# Patient Record
Sex: Male | Born: 1965 | Race: Black or African American | Hispanic: No | Marital: Single | Smoking: Former smoker
Health system: Southern US, Community
[De-identification: ages and names within clinical notes are randomized; demographics above are authoritative.]

## PROBLEM LIST (undated history)

## (undated) DIAGNOSIS — R066 Hiccough: Secondary | ICD-10-CM

## (undated) DIAGNOSIS — G8929 Other chronic pain: Secondary | ICD-10-CM

## (undated) DIAGNOSIS — M545 Low back pain, unspecified: Secondary | ICD-10-CM

## (undated) DIAGNOSIS — G35 Multiple sclerosis: Secondary | ICD-10-CM

## (undated) DIAGNOSIS — G893 Neoplasm related pain (acute) (chronic): Secondary | ICD-10-CM

## (undated) DIAGNOSIS — I10 Essential (primary) hypertension: Secondary | ICD-10-CM

## (undated) DIAGNOSIS — M25561 Pain in right knee: Secondary | ICD-10-CM

## (undated) DIAGNOSIS — C3490 Malignant neoplasm of unspecified part of unspecified bronchus or lung: Secondary | ICD-10-CM

## (undated) HISTORY — DX: Pain in right knee: M25.561

## (undated) HISTORY — DX: Malignant neoplasm of unspecified part of unspecified bronchus or lung: C34.90

## (undated) HISTORY — DX: Hiccough: R06.6

## (undated) HISTORY — DX: Neoplasm related pain (acute) (chronic): G89.3

## (undated) HISTORY — DX: Low back pain: M54.5

## (undated) HISTORY — DX: Other chronic pain: G89.29

## (undated) HISTORY — DX: Low back pain, unspecified: M54.50

## (undated) HISTORY — PX: CARDIAC SURGERY: SHX584

## (undated) HISTORY — DX: Multiple sclerosis: G35

## (undated) HISTORY — DX: Essential (primary) hypertension: I10

---

## 2006-12-21 ENCOUNTER — Emergency Department: Payer: Self-pay | Admitting: Emergency Medicine

## 2007-04-09 ENCOUNTER — Emergency Department: Payer: Self-pay | Admitting: Internal Medicine

## 2007-07-13 ENCOUNTER — Ambulatory Visit: Payer: Self-pay | Admitting: Ophthalmology

## 2007-07-13 ENCOUNTER — Emergency Department: Payer: Self-pay | Admitting: Emergency Medicine

## 2007-08-07 ENCOUNTER — Emergency Department: Payer: Self-pay | Admitting: Emergency Medicine

## 2008-02-08 ENCOUNTER — Emergency Department: Payer: Self-pay | Admitting: Internal Medicine

## 2008-03-16 ENCOUNTER — Ambulatory Visit: Payer: Self-pay | Admitting: Neurology

## 2008-04-01 ENCOUNTER — Emergency Department: Payer: Self-pay | Admitting: Internal Medicine

## 2008-04-20 ENCOUNTER — Ambulatory Visit: Payer: Self-pay | Admitting: Pain Medicine

## 2008-06-18 ENCOUNTER — Emergency Department: Payer: Self-pay | Admitting: Emergency Medicine

## 2009-01-09 ENCOUNTER — Emergency Department: Payer: Self-pay | Admitting: Emergency Medicine

## 2009-10-03 ENCOUNTER — Ambulatory Visit: Payer: Self-pay | Admitting: Surgery

## 2009-10-10 ENCOUNTER — Inpatient Hospital Stay: Payer: Self-pay | Admitting: Surgery

## 2009-10-10 ENCOUNTER — Ambulatory Visit: Payer: Self-pay | Admitting: Surgery

## 2010-03-17 ENCOUNTER — Emergency Department: Payer: Self-pay | Admitting: Emergency Medicine

## 2010-05-29 ENCOUNTER — Ambulatory Visit: Payer: Self-pay | Admitting: Surgery

## 2010-05-31 ENCOUNTER — Ambulatory Visit: Payer: Self-pay | Admitting: Surgery

## 2010-05-31 ENCOUNTER — Emergency Department: Payer: Self-pay | Admitting: Emergency Medicine

## 2010-06-03 LAB — PATHOLOGY REPORT

## 2010-09-07 ENCOUNTER — Emergency Department: Payer: Self-pay | Admitting: Internal Medicine

## 2011-07-30 ENCOUNTER — Emergency Department: Payer: Self-pay | Admitting: Emergency Medicine

## 2011-09-02 ENCOUNTER — Ambulatory Visit: Payer: Self-pay | Admitting: Neurology

## 2011-09-02 LAB — CREATININE, SERUM
Creatinine: 1.1 mg/dL (ref 0.60–1.30)
EGFR (African American): >60
EGFR (Non-African Amer.): >60

## 2011-11-17 ENCOUNTER — Ambulatory Visit: Payer: Self-pay

## 2012-04-23 ENCOUNTER — Emergency Department: Payer: Self-pay | Admitting: Internal Medicine
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
## 2014-11-07 NOTE — Op Note (Signed)
PATIENT NAME:  Ryan Cannon, Ryan Cannon MR#:  [id number] DATE OF BIRTH:  [birth year]  DATE OF PROCEDURE:  04/23/2012  PREOPERATIVE DIAGNOSIS: Right scrotal wall abscess.   POSTOPERATIVE DIAGNOSIS: Right scrotal wall abscess.   PROCEDURE: Incision and drainage of scrotal wall abscess.   PLACE: Emergency Department.   SURGEON: Shanda Cadotte C. Bernardo Heater, MD   HISTORY OF PRESENT ILLNESS: Refer to the dictated consultation note. He has requested incision and drainage at bedside.   DESCRIPTION OF PROCEDURE: The patient was in the supine position on the ER bed. The groin site was prepped and draped in the usual fashion. He was given 2 mg of IV morphine by the Emergency Department  staff. The fluctuant area was anesthetized with approximately 3 mL of 1% plain Xylocaine. Using a #11 blade, an approximately 3 cm incision was made and a moderate amount of purulent drainage was obtained. A hemostat was inserted in the site, and no loculations were present. The site was irrigated with approximately 50 mL of saline. No significant bleeding was noted. A 1/4-inch iodoform dressing was placed along with 4 x 4's and a swimmer supporter.   PLAN: He will follow up in the office 10/07 for I and D site check. He was instructed to return to the Emergency Department for any increasing pain or development of fever. He was previously given pain medication and antibiotic prescription by the Emergency Department staff.  ____________________________ Ronda Fairly. Bernardo Heater, MD scs:cbb D: 04/23/2012 15:37:54 ET T: 04/23/2012 16:29:27 ET JOB#: [id number]  cc: Nicki Reaper C. Bernardo Heater, MD, <Dictator> Abbie Sons MD ELECTRONICALLY SIGNED 04/28/2012 15:26

## 2015-02-09 ENCOUNTER — Ambulatory Visit: Payer: Self-pay | Admitting: Family Medicine

## 2015-06-07 ENCOUNTER — Encounter: Payer: Self-pay | Admitting: Family Medicine

## 2015-06-07 ENCOUNTER — Ambulatory Visit (INDEPENDENT_AMBULATORY_CARE_PROVIDER_SITE_OTHER): Payer: Medicaid Other | Admitting: Family Medicine

## 2015-06-07 VITALS — BP 122/78 | HR 80 | Temp 98.6°F | Resp 16 | Ht 69.0 in | Wt 184.4 lb

## 2015-06-07 DIAGNOSIS — G35 Multiple sclerosis: Secondary | ICD-10-CM | POA: Insufficient documentation

## 2015-06-07 DIAGNOSIS — Z716 Tobacco abuse counseling: Secondary | ICD-10-CM | POA: Insufficient documentation

## 2015-06-07 DIAGNOSIS — R519 Headache, unspecified: Secondary | ICD-10-CM | POA: Insufficient documentation

## 2015-06-07 DIAGNOSIS — R51 Headache: Secondary | ICD-10-CM

## 2015-06-07 DIAGNOSIS — R569 Unspecified convulsions: Secondary | ICD-10-CM | POA: Insufficient documentation

## 2015-06-07 DIAGNOSIS — J449 Chronic obstructive pulmonary disease, unspecified: Secondary | ICD-10-CM | POA: Diagnosis not present

## 2015-06-07 DIAGNOSIS — R29898 Other symptoms and signs involving the musculoskeletal system: Secondary | ICD-10-CM

## 2015-06-07 DIAGNOSIS — G894 Chronic pain syndrome: Secondary | ICD-10-CM | POA: Diagnosis not present

## 2015-06-07 DIAGNOSIS — Z720 Tobacco use: Secondary | ICD-10-CM

## 2015-06-07 DIAGNOSIS — G8929 Other chronic pain: Secondary | ICD-10-CM | POA: Insufficient documentation

## 2015-06-07 MED ORDER — BUPROPION HCL ER (SR) 150 MG PO TB12: 150.0000 mg | ORAL_TABLET | Freq: Two times a day (BID) | ORAL | Status: DC

## 2015-06-07 MED ORDER — TRAMADOL HCL 50 MG PO TABS: 50.0000 mg | ORAL_TABLET | Freq: Two times a day (BID) | ORAL | Status: DC | PRN
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
## 2015-07-25 ENCOUNTER — Other Ambulatory Visit: Payer: Self-pay | Admitting: Family Medicine

## 2015-07-25 DIAGNOSIS — G35 Multiple sclerosis: Secondary | ICD-10-CM

## 2015-07-25 DIAGNOSIS — G894 Chronic pain syndrome: Secondary | ICD-10-CM

## 2015-07-25 NOTE — Telephone Encounter (Signed)
Patient was prescribed Tramadol for the pain in his leg. Stated that it really helped and is requesting a refill. If possible please send to [facility] [address]

## 2015-07-27 ENCOUNTER — Other Ambulatory Visit: Payer: Self-pay | Admitting: Neurology

## 2015-07-27 DIAGNOSIS — G8929 Other chronic pain: Secondary | ICD-10-CM

## 2015-07-27 DIAGNOSIS — R51 Headache: Secondary | ICD-10-CM

## 2015-07-27 DIAGNOSIS — G35 Multiple sclerosis: Secondary | ICD-10-CM

## 2015-07-27 MED ORDER — TRAMADOL HCL 50 MG PO TABS: 50.0000 mg | ORAL_TABLET | Freq: Two times a day (BID) | ORAL | Status: DC | PRN

## 2015-08-16 ENCOUNTER — Ambulatory Visit
Admission: RE | Admit: 2015-08-16 | Discharge: 2015-08-16 | Disposition: A | Discharge status: Home / Self Care | Payer: Medicaid Other | Source: Ambulatory Visit | Attending: Neurology | Admitting: Neurology

## 2015-08-16 DIAGNOSIS — G35 Multiple sclerosis: Secondary | ICD-10-CM

## 2015-08-16 DIAGNOSIS — R51 Headache: Secondary | ICD-10-CM | POA: Diagnosis present

## 2015-08-16 DIAGNOSIS — G3189 Other specified degenerative diseases of nervous system: Secondary | ICD-10-CM | POA: Insufficient documentation

## 2015-08-16 DIAGNOSIS — R519 Headache, unspecified: Secondary | ICD-10-CM

## 2015-08-16 DIAGNOSIS — G8929 Other chronic pain: Secondary | ICD-10-CM

## 2015-08-16 MED ORDER — GADOBENATE DIMEGLUMINE 529 MG/ML IV SOLN
20.0000 mL | Freq: Once | INTRAVENOUS | Status: AC | PRN
  Administered 2015-08-16: 17 mL via INTRAVENOUS

## 2015-09-03 ENCOUNTER — Encounter: Payer: Self-pay | Admitting: Family Medicine

## 2015-09-03 ENCOUNTER — Ambulatory Visit (INDEPENDENT_AMBULATORY_CARE_PROVIDER_SITE_OTHER): Payer: Medicaid Other | Admitting: Family Medicine

## 2015-09-03 VITALS — BP 126/80 | HR 98 | Temp 98.5°F | Resp 14 | Ht 69.0 in | Wt 185.0 lb

## 2015-09-03 DIAGNOSIS — L0293 Carbuncle, unspecified: Secondary | ICD-10-CM | POA: Diagnosis not present

## 2015-09-03 DIAGNOSIS — G35 Multiple sclerosis: Secondary | ICD-10-CM

## 2015-09-03 DIAGNOSIS — G894 Chronic pain syndrome: Secondary | ICD-10-CM

## 2015-09-03 MED ORDER — SULFAMETHOXAZOLE-TRIMETHOPRIM 800-160 MG PO TABS: 1.0000 | ORAL_TABLET | Freq: Two times a day (BID) | ORAL | Status: DC

## 2015-09-03 MED ORDER — OXYCODONE-ACETAMINOPHEN 5-325 MG PO TABS: 1.0000 | ORAL_TABLET | Freq: Three times a day (TID) | ORAL | Status: DC | PRN
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
## 2015-09-20 ENCOUNTER — Other Ambulatory Visit: Payer: Self-pay | Admitting: Family Medicine

## 2015-09-20 DIAGNOSIS — L0293 Carbuncle, unspecified: Secondary | ICD-10-CM

## 2015-09-20 NOTE — Telephone Encounter (Signed)
Patient is requesting a refill on the following medications: oxyCodone-acetaminophen Bactrim

## 2015-09-24 ENCOUNTER — Other Ambulatory Visit: Payer: Self-pay

## 2015-09-24 DIAGNOSIS — L0293 Carbuncle, unspecified: Secondary | ICD-10-CM

## 2015-10-23 ENCOUNTER — Encounter: Payer: Self-pay | Admitting: *Deleted

## 2015-10-23 ENCOUNTER — Encounter: Payer: Self-pay | Admitting: Family Medicine

## 2015-10-23 ENCOUNTER — Ambulatory Visit (INDEPENDENT_AMBULATORY_CARE_PROVIDER_SITE_OTHER): Payer: Medicaid Other | Admitting: Family Medicine

## 2015-10-23 VITALS — BP 136/72 | HR 69 | Temp 97.7°F | Resp 14 | Wt 187.0 lb

## 2015-10-23 DIAGNOSIS — L0293 Carbuncle, unspecified: Secondary | ICD-10-CM | POA: Diagnosis not present

## 2015-10-23 DIAGNOSIS — B009 Herpesviral infection, unspecified: Secondary | ICD-10-CM | POA: Insufficient documentation

## 2015-10-23 DIAGNOSIS — L732 Hidradenitis suppurativa: Secondary | ICD-10-CM | POA: Insufficient documentation

## 2015-10-23 DIAGNOSIS — B001 Herpesviral vesicular dermatitis: Secondary | ICD-10-CM

## 2015-10-23 MED ORDER — OXYCODONE-ACETAMINOPHEN 5-325 MG PO TABS: 1.0000 | ORAL_TABLET | Freq: Four times a day (QID) | ORAL | Status: DC | PRN

## 2015-10-23 MED ORDER — VALACYCLOVIR HCL 1 G PO TABS: ORAL_TABLET | ORAL | Status: DC

## 2015-10-23 MED ORDER — CLINDAMYCIN HCL 300 MG PO CAPS: 300.0000 mg | ORAL_CAPSULE | Freq: Three times a day (TID) | ORAL | Status: DC

## 2015-10-23 NOTE — Assessment & Plan Note (Signed)
Symptoms consistent with HSV; valacyclovir PRN outbreaks; I explained the lesions in the groin area do not appear to be HSV

## 2015-10-23 NOTE — Assessment & Plan Note (Signed)
Explained diagnosis; refer to general surgeon; start antibiotics
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
## 2015-10-23 NOTE — Patient Instructions (Addendum)
Start the new antibiotic Please do eat yogurt daily or take a probiotic daily for the next month or two We want to replace the healthy germs in the gut If you notice foul, watery diarrhea in the next two months, schedule an appointment RIGHT AWAY We'll refer you to a general surgeon about your condition (hidradenitis suppurativa) If you have not heard anything from my staff in a week about any orders/referrals/studies from today, please contact us here to follow-up [phone] Never drink alcohol or take any other pain pills or controlled substances while taking the pain medicine I prescribed today Your goal blood pressure is less than 140 mmHg on top. Try to follow the DASH guidelines (DASH stands for Dietary Approaches to Stop Hypertension) Try to limit the sodium in your diet.  Ideally, consume less than 1.5 grams (less than 1,'500mg'$ ) per day. Do not add salt when cooking or at the table.  Check the sodium amount on labels when shopping, and choose items lower in sodium when given a choice. Avoid or limit foods that already contain a lot of sodium. Eat a diet rich in fruits and vegetables and whole grains. Use the antiviral medicine for the fever blister outbreaksHidradenitis Suppurativa Hidradenitis suppurativa is a long-term (chronic) skin disease that starts with blocked sweat glands or hair follicles. Bacteria may grow in these blocked openings of your skin. Hidradenitis suppurativa is like a severe form of acne that develops in areas of your body where acne would be unusual. It is most likely to affect the areas of your body where skin rubs against skin and becomes moist. This includes your:  Underarms.  Groin.  Genital areas.  Buttocks.  Upper thighs.  Breasts. Hidradenitis suppurativa may start out with small pimples. The pimples can develop into deep sores that break open (rupture) and drain pus. Over time your skin may thicken and become scarred. Hidradenitis suppurativa cannot  be passed from person to person.  CAUSES  The exact cause of hidradenitis suppurativa is not known. This condition may be due to:  Male and male hormones. The condition is rare before and after puberty.  An overactive body defense system (immune system). Your immune system may overreact to the blocked hair follicles or sweat glands and cause swelling and pus-filled sores. RISK FACTORS You may have a higher risk of hidradenitis suppurativa if you:  Are a woman.  Are between ages 23 and 46.  Have a family history of hidradenitis suppurativa.  Have a personal history of acne.  Are overweight.  Smoke.  Take the drug lithium. SIGNS AND SYMPTOMS  The first signs of an outbreak are usually painful skin bumps that look like pimples. As the condition progresses:  Skin bumps may get bigger and grow deeper into the skin.  Bumps under the skin may rupture and drain smelly pus.  Skin may become itchy and infected.  Skin may thicken and scar.  Drainage may continue through tunnels under the skin (fistulas).  Walking and moving your arms can become painful. DIAGNOSIS  Your health care provider may diagnose hidradenitis suppurativa based on your medical history and your signs and symptoms. A physical exam will also be done. You may need to see a health care provider who specializes in skin diseases (dermatologist). You may also have tests done to confirm the diagnosis. These can include:  Swabbing a sample of pus or drainage from your skin so it can be sent to the lab and tested for infection.  Blood tests  to check for infection. TREATMENT  The same treatment will not work for everybody with hidradenitis suppurativa. Your treatment will depend on how severe your symptoms are. You may need to try several treatments to find what works best for you. Part of your treatment may include cleaning and bandaging (dressing) your wounds. You may also have to take medicines, such as the  following:  Antibiotics.  Acne medicines.  Medicines to block or suppress the immune system.  A diabetes medicine (metformin) is sometimes used to treat this condition.  For women, birth control pills can sometimes help relieve symptoms. You may need surgery if you have a severe case of hidradenitis suppurativa that does not respond to medicine. Surgery may involve:   Using a laser to clear the skin and remove hair follicles.  Opening and draining deep sores.  Removing the areas of skin that are diseased and scarred. HOME CARE INSTRUCTIONS  Learn as much as you can about your disease, and work closely with your health care providers.  Take medicines only as directed by your health care provider.  If you were prescribed an antibiotic medicine, finish it all even if you start to feel better.  If you are overweight, losing weight may be very helpful. Try to reach and maintain a healthy weight.  Do not use any tobacco products, including cigarettes, chewing tobacco, or electronic cigarettes. If you need help quitting, ask your health care provider.  Do not shave the areas where you get hidradenitis suppurativa.  Do not wear deodorant.  Wear loose-fitting clothes.  Try not to overheat and get sweaty.  Take a daily bleach bath as directed by your health care provider.  Fill your bathtub halfway with water.  Pour in  cup of unscented household bleach.  Soak for 5-10 minutes.  Cover sore areas with a warm, clean washcloth (compress) for 5-10 minutes. SEEK MEDICAL CARE IF:   You have a flare-up of hidradenitis suppurativa.  You have chills or a fever.  You are having trouble controlling your symptoms at home.   This information is not intended to replace advice given to you by your health care provider. Make sure you discuss any questions you have with your health care provider.   Document Released: 02/19/2004 Document Revised: 07/28/2014 Document Reviewed:  10/07/2013 Elsevier Interactive Patient Education [employer].

## 2015-10-29 ENCOUNTER — Ambulatory Visit (INDEPENDENT_AMBULATORY_CARE_PROVIDER_SITE_OTHER): Payer: Medicaid Other | Admitting: General Surgery

## 2015-10-29 VITALS — BP 134/78 | HR 78 | Resp 14 | Ht 68.0 in | Wt 185.0 lb

## 2015-10-29 DIAGNOSIS — L732 Hidradenitis suppurativa: Secondary | ICD-10-CM

## 2015-10-29 MED ORDER — DOXYCYCLINE HYCLATE 50 MG PO CAPS: 50.0000 mg | ORAL_CAPSULE | Freq: Every day | ORAL | Status: DC
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
## 2015-10-31 ENCOUNTER — Encounter: Payer: Self-pay | Admitting: General Surgery

## 2015-11-27 ENCOUNTER — Ambulatory Visit: Payer: Medicaid Other | Admitting: General Surgery

## 2015-12-26 ENCOUNTER — Encounter: Payer: Self-pay | Admitting: *Deleted

## 2015-12-31 ENCOUNTER — Other Ambulatory Visit: Payer: Self-pay

## 2015-12-31 MED ORDER — VALACYCLOVIR HCL 1 G PO TABS: ORAL_TABLET | ORAL | Status: DC

## 2016-01-01 ENCOUNTER — Encounter: Payer: Self-pay | Admitting: General Surgery

## 2016-01-01 ENCOUNTER — Ambulatory Visit (INDEPENDENT_AMBULATORY_CARE_PROVIDER_SITE_OTHER): Payer: Medicaid Other | Admitting: General Surgery

## 2016-01-01 VITALS — BP 130/74 | HR 82 | Resp 12 | Ht 68.0 in | Wt 177.0 lb

## 2016-01-01 DIAGNOSIS — L0591 Pilonidal cyst without abscess: Secondary | ICD-10-CM

## 2016-01-01 MED ORDER — SULFAMETHOXAZOLE-TRIMETHOPRIM 400-80 MG PO TABS: 1.0000 | ORAL_TABLET | Freq: Two times a day (BID) | ORAL | Status: AC

## 2016-01-01 NOTE — Patient Instructions (Signed)
Patient to return in one week. 
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
## 2016-01-07 ENCOUNTER — Ambulatory Visit: Payer: Medicaid Other | Admitting: General Surgery

## 2016-01-15 ENCOUNTER — Encounter: Payer: Self-pay | Admitting: *Deleted

## 2016-01-24 ENCOUNTER — Ambulatory Visit: Payer: Medicaid Other | Admitting: General Surgery

## 2016-02-13 ENCOUNTER — Ambulatory Visit: Payer: Medicaid Other | Admitting: General Surgery

## 2016-02-13 ENCOUNTER — Ambulatory Visit: Payer: Medicaid Other | Admitting: Family Medicine

## 2016-02-27 ENCOUNTER — Ambulatory Visit: Payer: Medicaid Other | Admitting: General Surgery

## 2016-02-28 ENCOUNTER — Ambulatory Visit: Payer: Medicaid Other | Admitting: Family Medicine

## 2016-03-05 ENCOUNTER — Ambulatory Visit (INDEPENDENT_AMBULATORY_CARE_PROVIDER_SITE_OTHER): Payer: Medicaid Other | Admitting: General Surgery

## 2016-03-05 ENCOUNTER — Encounter: Payer: Self-pay | Admitting: General Surgery

## 2016-03-05 VITALS — BP 132/74 | HR 74 | Resp 12 | Ht 70.0 in | Wt 178.0 lb

## 2016-03-05 DIAGNOSIS — L0591 Pilonidal cyst without abscess: Secondary | ICD-10-CM | POA: Diagnosis not present

## 2016-03-05 MED ORDER — DOXYCYCLINE HYCLATE 50 MG PO CAPS: 50.0000 mg | ORAL_CAPSULE | Freq: Every day | ORAL | 1 refills | Status: DC
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
## 2016-03-05 NOTE — Patient Instructions (Addendum)
Patient to return in two months.  

## 2016-03-31 ENCOUNTER — Encounter: Payer: Self-pay | Admitting: Family Medicine

## 2016-03-31 ENCOUNTER — Other Ambulatory Visit: Payer: Self-pay | Admitting: General Surgery

## 2016-03-31 ENCOUNTER — Ambulatory Visit: Payer: Medicaid Other | Admitting: Family Medicine

## 2016-03-31 VITALS — BP 134/74 | HR 64 | Temp 97.8°F | Wt 184.2 lb

## 2016-03-31 NOTE — Progress Notes (Signed)
   BP 134/74   Pulse 64   Temp 97.8 F (36.6 C) (Oral)   Wt 184 lb 3.2 oz (83.6 kg)   SpO2 95%   BMI 26.43 kg/m    **patient was sent to [facility] walk-in clinic**  Subjective:    Patient ID: Ryan Cannon, male    DOB: January 12, 1966, 50 y.o.   MRN: [id number]  HPI: Ryan Cannon is a 50 y.o. male  Chief Complaint  Patient presents with  . Recurrent Skin Infections   Boil on left side of buttock; very painful  Review of Systems     Objective:    BP 134/74   Pulse 64   Temp 97.8 F (36.6 C) (Oral)   Wt 184 lb 3.2 oz (83.6 kg)   SpO2 95%   BMI 26.43 kg/m     Physical Exam  Skin:  Abscess left buttock; tender, tense      Assessment & Plan:   Problem List Items Addressed This Visit    None    Visit Diagnoses    Abscess of left buttock    -  Primary   I'm sending him to [facility] clinic walk-in for evaluation and treatment      Follow up plan: No Follow-up on file.

## 2016-05-06 ENCOUNTER — Ambulatory Visit: Payer: Medicaid Other | Admitting: General Surgery

## 2016-05-20 ENCOUNTER — Ambulatory Visit: Payer: Medicaid Other | Admitting: General Surgery

## 2016-07-17 ENCOUNTER — Encounter: Payer: Self-pay | Admitting: *Deleted

## 2016-09-16 ENCOUNTER — Ambulatory Visit: Payer: Medicaid Other | Admitting: Family Medicine

## 2016-09-25 ENCOUNTER — Ambulatory Visit: Payer: Medicaid Other | Admitting: Family Medicine

## 2016-10-13 ENCOUNTER — Ambulatory Visit (INDEPENDENT_AMBULATORY_CARE_PROVIDER_SITE_OTHER): Payer: Medicaid Other | Admitting: Family Medicine

## 2016-10-13 ENCOUNTER — Ambulatory Visit: Payer: Medicaid Other | Admitting: Family Medicine

## 2016-10-13 ENCOUNTER — Encounter: Payer: Self-pay | Admitting: Family Medicine

## 2016-10-13 VITALS — BP 122/68 | HR 85 | Temp 98.0°F | Resp 16 | Wt 185.0 lb

## 2016-10-13 DIAGNOSIS — Z114 Encounter for screening for human immunodeficiency virus [HIV]: Secondary | ICD-10-CM | POA: Diagnosis not present

## 2016-10-13 DIAGNOSIS — Z1159 Encounter for screening for other viral diseases: Secondary | ICD-10-CM | POA: Diagnosis not present

## 2016-10-13 DIAGNOSIS — H05223 Edema of bilateral orbit: Secondary | ICD-10-CM

## 2016-10-13 DIAGNOSIS — B001 Herpesviral vesicular dermatitis: Secondary | ICD-10-CM

## 2016-10-13 LAB — COMPLETE METABOLIC PANEL WITH GFR
ALT: 10 U/L (ref 9–46)
AST: 16 U/L (ref 10–35)
Albumin: 4.1 g/dL (ref 3.6–5.1)
Alkaline Phosphatase: 39 U/L — ABNORMAL LOW (ref 40–115)
BUN: 10 mg/dL (ref 7–25)
CHLORIDE: 106 mmol/L (ref 98–110)
CO2: 28 mmol/L (ref 20–31)
Calcium: 9.6 mg/dL (ref 8.6–10.3)
Creat: 1.06 mg/dL (ref 0.70–1.33)
GFR, EST NON AFRICAN AMERICAN: 80 mL/min (ref >=60)
Glucose, Bld: 89 mg/dL (ref 65–99)
POTASSIUM: 4.8 mmol/L (ref 3.5–5.3)
Sodium: 143 mmol/L (ref 135–146)
Total Bilirubin: 0.5 mg/dL (ref 0.2–1.2)
Total Protein: 7.3 g/dL (ref 6.1–8.1)

## 2016-10-13 MED ORDER — VALACYCLOVIR HCL 1 G PO TABS: ORAL_TABLET | ORAL | 5 refills | Status: DC

## 2016-10-13 NOTE — Patient Instructions (Addendum)
You might try B complex stress tab or vitamin and that may help outbreaks I do encourage you to quit smoking Call [phone] to sign up for smoking cessation classes You can call 1-800-QUIT-NOW to talk with a smoking cessation coach   Steps to Quit Smoking Smoking tobacco can be bad for your health. It can also affect almost every organ in your body. Smoking puts you and people around you at risk for many serious long-lasting (chronic) diseases. Quitting smoking is hard, but it is one of the best things that you can do for your health. It is never too late to quit. What are the benefits of quitting smoking? When you quit smoking, you lower your risk for getting serious diseases and conditions. They can include:  Lung cancer or lung disease.  Heart disease.  Stroke.  Heart attack.  Not being able to have children (infertility).  Weak bones (osteoporosis) and broken bones (fractures). If you have coughing, wheezing, and shortness of breath, those symptoms may get better when you quit. You may also get sick less often. If you are pregnant, quitting smoking can help to lower your chances of having a baby of low birth weight. What can I do to help me quit smoking? Talk with your doctor about what can help you quit smoking. Some things you can do (strategies) include:  Quitting smoking totally, instead of slowly cutting back how much you smoke over a period of time.  Going to in-person counseling. You are more likely to quit if you go to many counseling sessions.  Using resources and support systems, such as:  Online chats with a Social worker.  Phone quitlines.  Printed Furniture conservator/restorer.  Support groups or group counseling.  Text messaging programs.  Mobile phone apps or applications.  Taking medicines. Some of these medicines may have nicotine in them. If you are pregnant or breastfeeding, do not take any medicines to quit smoking unless your doctor says it is okay. Talk with  your doctor about counseling or other things that can help you. Talk with your doctor about using more than one strategy at the same time, such as taking medicines while you are also going to in-person counseling. This can help make quitting easier. What things can I do to make it easier to quit? Quitting smoking might feel very hard at first, but there is a lot that you can do to make it easier. Take these steps:  Talk to your family and friends. Ask them to support and encourage you.  Call phone quitlines, reach out to support groups, or work with a Social worker.  Ask people who smoke to not smoke around you.  Avoid places that make you want (trigger) to smoke, such as:  Bars.  Parties.  Smoke-break areas at work.  Spend time with people who do not smoke.  Lower the stress in your life. Stress can make you want to smoke. Try these things to help your stress:  Getting regular exercise.  Deep-breathing exercises.  Yoga.  Meditating.  Doing a body scan. To do this, close your eyes, focus on one area of your body at a time from head to toe, and notice which parts of your body are tense. Try to relax the muscles in those areas.  Download or buy apps on your mobile phone or tablet that can help you stick to your quit plan. There are many free apps, such as QuitGuide from the [employer] Office manager for Disease Control and Prevention). You can find  more support from smokefree.gov and other websites. This information is not intended to replace advice given to you by your health care provider. Make sure you discuss any questions you have with your health care provider. Document Released: 05/03/2009 Document Revised: 03/04/2016 Document Reviewed: 11/21/2014 Elsevier Interactive Patient Education  2017 [employer] Inc[employer]  Cold Sore A cold sore, also called a fever blister, is a skin infection that causes small, fluid-filled sores to form inside of the mouth or on the lips, gums, nose, chin, or cheeks.  Cold sores can spread to other parts of the body, such as the eyes or fingers. In some people with other medical conditions, cold sores can spread to multiple other body sites, including the genitals. Cold sores can be spread or passed from person to person (contagious) until the sores crust over completely. What are the causes? Cold sores are caused by the herpes simplex virus (HSV-1). HSV-1 is closely related to the virus that causes genital herpes (HSV-2), but these viruses are not the same. Once a person is infected with HSV-1, the virus remains permanently in the body. HSV-1 is spread from person to person through close contact, such as through kissing, touching the affected area, or sharing personal items such as lip balm, razors, or eating utensils. What increases the risk? A cold sore outbreak is more likely to develop in people who:  Are tired, stressed, or sick.  Are menstruating.  Are pregnant.  Take certain medicines.  Are exposed to cold weather or too much sun. What are the signs or symptoms? Symptoms of a cold sore outbreak often go through different stages. Here is how a cold sore develops:  Tingling, itching, or burning is felt 1-2 days before the outbreak.  Fluid-filled blisters appear on the lips, inside the mouth, on the nose, or on the cheeks.  The blisters start to ooze clear fluid.  The blisters dry up and a yellow crust appears in its place.  The crust falls off. Other symptoms include:  Fever.  Sore throat.  Headache.  Muscle aches.  Swollen neck glands. You also may not have any symptoms. How is this diagnosed? This condition is often diagnosed based on your medical history and a physical exam. Your health care provider may swab your sore and then examine it in the lab. Rarely, blood tests may be done to check for HSV-1. How is this treated? There is no cure for cold sores or HSV-1. There also is no vaccine for HSV-1. Most cold sores go away on  their own without treatment within two weeks. Medicines cannot make the infection go away, but medicines can:  Help relieve some of the pain associated with the sores.  Work to stop the virus from multiplying.  Shorten healing time. Medicines may be in the form of creams, gels, pills, or a shot. Follow these instructions at home: Medicines   Take or apply over-the-counter and prescription medicines only as told by your health care provider.  Use a cotton-tip swab to apply creams or gels to your sores. Sore Care   Do not touch the sores or pick the scabs.  Wash your hands often. Do not touch your eyes without washing your hands first.  Keep the sores clean and dry.  If directed, apply ice to the sores:  Put ice in a plastic bag.  Place a towel between your skin and the bag.  Leave the ice on for 20 minutes, 2-3 times per day. Lifestyle   Do not  kiss, have oral sex, or share personal items until your sores heal.  Eat a soft, bland diet. Avoid eating hot, cold, or salty foods. These can hurt your mouth.  Use a straw if it hurts to drink out of a glass.  Avoid the sun and limit your stress if these things trigger outbreaks. If sun causes cold sores, apply sunscreen on your lips before being out in the sun. Contact a health care provider if:  You have symptoms for more than two weeks.  You have pus coming from the sores.  You have redness that is spreading.  You have pain or irritation in your eye.  You get sores on your genitals.  Your sores do not heal within two weeks.  You have frequent cold sore outbreaks. Get help right away if:  You have a fever and your symptoms suddenly get worse.  You have a headache and confusion. This information is not intended to replace advice given to you by your health care provider. Make sure you discuss any questions you have with your health care provider. Document Released: 07/04/2000 Document Revised: 02/29/2016 Document  Reviewed: 04/27/2015 Elsevier Interactive Patient Education  2017 [employer].
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
## 2016-10-14 LAB — MICROALBUMIN / CREATININE URINE RATIO
Creatinine, Urine: 342 mg/dL (ref 20–370)
MICROALB UR: 3.1 mg/dL
MICROALB/CREAT RATIO: 9 ug/mg{creat} (ref <30)

## 2016-10-14 LAB — HIV ANTIBODY (ROUTINE TESTING W REFLEX): HIV 1&2 Ab, 4th Generation: NONREACTIVE

## 2016-10-14 LAB — HEPATITIS C ANTIBODY: HCV AB: NEGATIVE

## 2016-10-29 NOTE — Assessment & Plan Note (Signed)
Rx for valacyclovir written, explained contagious, l-lysine or B complex to reduce outbreaks

## 2017-06-15 ENCOUNTER — Other Ambulatory Visit: Payer: Self-pay | Admitting: Family Medicine

## 2018-01-08 ENCOUNTER — Other Ambulatory Visit: Payer: Self-pay | Admitting: Family Medicine

## 2018-01-08 NOTE — Telephone Encounter (Signed)
Patient has not been seen in over a year I've refilled his medicine, but please ask him to schedule a CPE in the next month or 6 weeks; thank you

## 2018-01-28 ENCOUNTER — Ambulatory Visit: Payer: Medicaid Other | Admitting: Family Medicine

## 2018-01-28 ENCOUNTER — Encounter: Payer: Self-pay | Admitting: Family Medicine

## 2018-01-28 VITALS — BP 150/94 | HR 76 | Temp 97.4°F | Resp 14 | Ht 70.0 in | Wt 179.1 lb

## 2018-01-28 DIAGNOSIS — I10 Essential (primary) hypertension: Secondary | ICD-10-CM

## 2018-01-28 DIAGNOSIS — G35D Multiple sclerosis, unspecified: Secondary | ICD-10-CM

## 2018-01-28 DIAGNOSIS — M21371 Foot drop, right foot: Secondary | ICD-10-CM | POA: Diagnosis not present

## 2018-01-28 DIAGNOSIS — R29898 Other symptoms and signs involving the musculoskeletal system: Secondary | ICD-10-CM | POA: Diagnosis not present

## 2018-01-28 DIAGNOSIS — Z5181 Encounter for therapeutic drug level monitoring: Secondary | ICD-10-CM

## 2018-01-28 DIAGNOSIS — R569 Unspecified convulsions: Secondary | ICD-10-CM

## 2018-01-28 DIAGNOSIS — J4489 Other specified chronic obstructive pulmonary disease: Secondary | ICD-10-CM

## 2018-01-28 DIAGNOSIS — H539 Unspecified visual disturbance: Secondary | ICD-10-CM | POA: Diagnosis not present

## 2018-01-28 DIAGNOSIS — G35 Multiple sclerosis: Secondary | ICD-10-CM | POA: Diagnosis not present

## 2018-01-28 DIAGNOSIS — J449 Chronic obstructive pulmonary disease, unspecified: Secondary | ICD-10-CM | POA: Diagnosis not present

## 2018-01-28 LAB — CBC WITH DIFFERENTIAL/PLATELET
BASOS PCT: 0.4 %
Basophils Absolute: 29 cells/uL (ref 0–200)
EOS PCT: 1.7 %
Eosinophils Absolute: 122 cells/uL (ref 15–500)
HCT: 46.7 % (ref 38.5–50.0)
HEMOGLOBIN: 15.9 g/dL (ref 13.2–17.1)
Lymphs Abs: 1735 cells/uL (ref 850–3900)
MCH: 30 pg (ref 27.0–33.0)
MCHC: 34 g/dL (ref 32.0–36.0)
MCV: 88.1 fL (ref 80.0–100.0)
MONOS PCT: 7 %
MPV: 11.9 fL (ref 7.5–12.5)
NEUTROS ABS: 4810 {cells}/uL (ref 1500–7800)
Neutrophils Relative %: 66.8 %
PLATELETS: 225 10*3/uL (ref 140–400)
RBC: 5.3 10*6/uL (ref 4.20–5.80)
RDW: 13.6 % (ref 11.0–15.0)
TOTAL LYMPHOCYTE: 24.1 %
WBC mixed population: 504 cells/uL (ref 200–950)
WBC: 7.2 10*3/uL (ref 3.8–10.8)

## 2018-01-28 LAB — COMPLETE METABOLIC PANEL WITH GFR
AG RATIO: 1.3 (calc) (ref 1.0–2.5)
ALKALINE PHOSPHATASE (APISO): 54 U/L (ref 40–115)
ALT: 15 U/L (ref 9–46)
AST: 22 U/L (ref 10–35)
Albumin: 4.5 g/dL (ref 3.6–5.1)
BILIRUBIN TOTAL: 0.3 mg/dL (ref 0.2–1.2)
BUN: 10 mg/dL (ref 7–25)
CHLORIDE: 106 mmol/L (ref 98–110)
CO2: 26 mmol/L (ref 20–32)
Calcium: 9.4 mg/dL (ref 8.6–10.3)
Creat: 1.14 mg/dL (ref 0.70–1.33)
GFR, Est African American: 85 mL/min/{1.73_m2} (ref >=60)
GFR, Est Non African American: 73 mL/min/{1.73_m2} (ref >=60)
GLOBULIN: 3.5 g/dL (ref 1.9–3.7)
Glucose, Bld: 83 mg/dL (ref 65–139)
POTASSIUM: 4.2 mmol/L (ref 3.5–5.3)
SODIUM: 141 mmol/L (ref 135–146)
Total Protein: 8 g/dL (ref 6.1–8.1)

## 2018-01-28 LAB — LIPID PANEL
CHOLESTEROL: 159 mg/dL (ref <200)
HDL: 55 mg/dL (ref >40)
LDL CHOLESTEROL (CALC): 90 mg/dL
Non-HDL Cholesterol (Calc): 104 mg/dL (calc) (ref <130)
Total CHOL/HDL Ratio: 2.9 (calc) (ref <5.0)
Triglycerides: 58 mg/dL (ref <150)

## 2018-01-28 MED ORDER — AMLODIPINE BESYLATE 5 MG PO TABS: 5.0000 mg | ORAL_TABLET | Freq: Every day | ORAL | 3 refills | Status: DC

## 2018-01-28 NOTE — Assessment & Plan Note (Signed)
Start medicine, trying to prevent stroke; limit salt intake try to follow DASH guidelines

## 2018-01-28 NOTE — Assessment & Plan Note (Signed)
Last seizure was about two years; knows to avoid baths and unsupervised swimming; seeing neurologist

## 2018-01-28 NOTE — Patient Instructions (Addendum)
Please do see Dr. Manuella Ghazi for the new foot drop and symptoms in your leg; do let him know about your pain Start the new medicine for high blood pressure Return in 7-10 days to see the CMA for recheck  I do encourage you to quit smoking Call [phone] to sign up for smoking cessation classes You can call 1-800-QUIT-NOW to talk with a smoking cessation coach  Try to follow the DASH guidelines (DASH stands for Dietary Approaches to Stop Hypertension). Try to limit the sodium in your diet to no more than 1,500mg  of sodium per day. Certainly try to not exceed 2,000 mg per day at the very most. Do not add salt when cooking or at the table.  Check the sodium amount on labels when shopping, and choose items lower in sodium when given a choice. Avoid or limit foods that already contain a lot of sodium. Eat a diet rich in fruits and vegetables and whole grains, and try to lose weight if overweight or obese   DASH Eating Plan DASH stands for "Dietary Approaches to Stop Hypertension." The DASH eating plan is a healthy eating plan that has been shown to reduce high blood pressure (hypertension). It may also reduce your risk for type 2 diabetes, heart disease, and stroke. The DASH eating plan may also help with weight loss. What are tips for following this plan? General guidelines  Avoid eating more than 2,300 mg (milligrams) of salt (sodium) a day. If you have hypertension, you may need to reduce your sodium intake to 1,500 mg a day.  Limit alcohol intake to no more than 1 drink a day for nonpregnant women and 2 drinks a day for men. One drink equals 12 oz of beer, 5 oz of wine, or 1 oz of hard liquor.  Work with your health care provider to maintain a healthy body weight or to lose weight. Ask what an ideal weight is for you.  Get at least 30 minutes of exercise that causes your heart to beat faster (aerobic exercise) most days of the week. Activities may include walking, swimming, or biking.  Work with  your health care provider or diet and nutrition specialist (dietitian) to adjust your eating plan to your individual calorie needs. Reading food labels  Check food labels for the amount of sodium per serving. Choose foods with less than 5 percent of the Daily Value of sodium. Generally, foods with less than 300 mg of sodium per serving fit into this eating plan.  To find whole grains, look for the word "whole" as the first word in the ingredient list. Shopping  Buy products labeled as "low-sodium" or "no salt added."  Buy fresh foods. Avoid canned foods and premade or frozen meals. Cooking  Avoid adding salt when cooking. Use salt-free seasonings or herbs instead of table salt or sea salt. Check with your health care provider or pharmacist before using salt substitutes.  Do not fry foods. Cook foods using healthy methods such as baking, boiling, grilling, and broiling instead.  Cook with heart-healthy oils, such as olive, canola, soybean, or sunflower oil. Meal planning   Eat a balanced diet that includes: ? 5 or more servings of fruits and vegetables each day. At each meal, try to fill half of your plate with fruits and vegetables. ? Up to 6-8 servings of whole grains each day. ? Less than 6 oz of lean meat, poultry, or fish each day. A 3-oz serving of meat is about the same size as  a deck of cards. One egg equals 1 oz. ? 2 servings of low-fat dairy each day. ? A serving of nuts, seeds, or beans 5 times each week. ? Heart-healthy fats. Healthy fats called Omega-3 fatty acids are found in foods such as flaxseeds and coldwater fish, like sardines, salmon, and mackerel.  Limit how much you eat of the following: ? Canned or prepackaged foods. ? Food that is high in trans fat, such as fried foods. ? Food that is high in saturated fat, such as fatty meat. ? Sweets, desserts, sugary drinks, and other foods with added sugar. ? Full-fat dairy products.  Do not salt foods before  eating.  Try to eat at least 2 vegetarian meals each week.  Eat more home-cooked food and less restaurant, buffet, and fast food.  When eating at a restaurant, ask that your food be prepared with less salt or no salt, if possible. What foods are recommended? The items listed may not be a complete list. Talk with your dietitian about what dietary choices are best for you. Grains Whole-grain or whole-wheat bread. Whole-grain or whole-wheat pasta. Brown rice. [place]. Bulgur. Whole-grain and low-sodium cereals. Pita bread. Low-fat, low-sodium crackers. Whole-wheat flour tortillas. Vegetables Fresh or frozen vegetables (raw, steamed, roasted, or grilled). Low-sodium or reduced-sodium tomato and vegetable juice. Low-sodium or reduced-sodium tomato sauce and tomato paste. Low-sodium or reduced-sodium canned vegetables. Fruits All fresh, dried, or frozen fruit. Canned fruit in natural juice (without added sugar). Meat and other protein foods Skinless chicken or [place]. Ground chicken or [place]. Pork with fat trimmed off. Fish and seafood. Egg whites. Dried beans, peas, or lentils. Unsalted nuts, nut butters, and seeds. Unsalted canned beans. Lean cuts of beef with fat trimmed off. Low-sodium, lean deli meat. Dairy Low-fat (1%) or fat-free (skim) milk. Fat-free, low-fat, or reduced-fat cheeses. Nonfat, low-sodium ricotta or cottage cheese. Low-fat or nonfat yogurt. Low-fat, low-sodium cheese. Fats and oils Soft margarine without trans fats. Vegetable oil. Low-fat, reduced-fat, or light mayonnaise and salad dressings (reduced-sodium). Canola, safflower, olive, soybean, and sunflower oils. Avocado. Seasoning and other foods Herbs. Spices. Seasoning mixes without salt. Unsalted popcorn and pretzels. Fat-free sweets. What foods are not recommended? The items listed may not be a complete list. Talk with your dietitian about what dietary choices are best for you. Grains Baked goods made with fat,  such as croissants, muffins, or some breads. Dry pasta or rice meal packs. Vegetables Creamed or fried vegetables. Vegetables in a cheese sauce. Regular canned vegetables (not low-sodium or reduced-sodium). Regular canned tomato sauce and paste (not low-sodium or reduced-sodium). Regular tomato and vegetable juice (not low-sodium or reduced-sodium). Angie Fava. Olives. Fruits Canned fruit in a light or heavy syrup. Fried fruit. Fruit in cream or butter sauce. Meat and other protein foods Fatty cuts of meat. Ribs. Fried meat. Berniece Salines. Sausage. Bologna and other processed lunch meats. Salami. Fatback. Hotdogs. Bratwurst. Salted nuts and seeds. Canned beans with added salt. Canned or smoked fish. Whole eggs or egg yolks. Chicken or [place] with skin. Dairy Whole or 2% milk, cream, and half-and-half. Whole or full-fat cream cheese. Whole-fat or sweetened yogurt. Full-fat cheese. Nondairy creamers. Whipped toppings. Processed cheese and cheese spreads. Fats and oils Butter. Stick margarine. Lard. Shortening. Ghee. Bacon fat. Tropical oils, such as coconut, palm kernel, or palm oil. Seasoning and other foods Salted popcorn and pretzels. Onion salt, garlic salt, seasoned salt, table salt, and sea salt. Worcestershire sauce. Tartar sauce. Barbecue sauce. Teriyaki sauce. Soy sauce, including reduced-sodium. Steak sauce.  Canned and packaged gravies. Fish sauce. Oyster sauce. Cocktail sauce. Horseradish that you find on the shelf. Ketchup. Mustard. Meat flavorings and tenderizers. Bouillon cubes. Hot sauce and Tabasco sauce. Premade or packaged marinades. Premade or packaged taco seasonings. Relishes. Regular salad dressings. Where to find more information:  National Heart, Lung, and [place]: https://wilson-eaton.com/  American Heart Association: www.heart.org Summary  The DASH eating plan is a healthy eating plan that has been shown to reduce high blood pressure (hypertension). It may also reduce your risk for  type 2 diabetes, heart disease, and stroke.  With the DASH eating plan, you should limit salt (sodium) intake to 2,300 mg a day. If you have hypertension, you may need to reduce your sodium intake to 1,500 mg a day.  When on the DASH eating plan, aim to eat more fresh fruits and vegetables, whole grains, lean proteins, low-fat dairy, and heart-healthy fats.  Work with your health care provider or diet and nutrition specialist (dietitian) to adjust your eating plan to your individual calorie needs. This information is not intended to replace advice given to you by your health care provider. Make sure you discuss any questions you have with your health care provider. Document Released: 06/26/2011 Document Revised: 06/30/2016 Document Reviewed: 06/30/2016 Elsevier Interactive Patient Education  2018 [employer].  Steps to Quit Smoking Smoking tobacco can be bad for your health. It can also affect almost every organ in your body. Smoking puts you and people around you at risk for many serious long-lasting (chronic) diseases. Quitting smoking is hard, but it is one of the best things that you can do for your health. It is never too late to quit. What are the benefits of quitting smoking? When you quit smoking, you lower your risk for getting serious diseases and conditions. They can include:  Lung cancer or lung disease.  Heart disease.  Stroke.  Heart attack.  Not being able to have children (infertility).  Weak bones (osteoporosis) and broken bones (fractures).  If you have coughing, wheezing, and shortness of breath, those symptoms may get better when you quit. You may also get sick less often. If you are pregnant, quitting smoking can help to lower your chances of having a baby of low birth weight. What can I do to help me quit smoking? Talk with your doctor about what can help you quit smoking. Some things you can do (strategies) include:  Quitting smoking totally, instead of slowly  cutting back how much you smoke over a period of time.  Going to in-person counseling. You are more likely to quit if you go to many counseling sessions.  Using resources and support systems, such as: ? Database administrator with a Social worker. ? Phone quitlines. ? Careers information officer. ? Support groups or group counseling. ? Text messaging programs. ? Mobile phone apps or applications.  Taking medicines. Some of these medicines may have nicotine in them. If you are pregnant or breastfeeding, do not take any medicines to quit smoking unless your doctor says it is okay. Talk with your doctor about counseling or other things that can help you.  Talk with your doctor about using more than one strategy at the same time, such as taking medicines while you are also going to in-person counseling. This can help make quitting easier. What things can I do to make it easier to quit? Quitting smoking might feel very hard at first, but there is a lot that you can do to make it easier. Take  these steps:  Talk to your family and friends. Ask them to support and encourage you.  Call phone quitlines, reach out to support groups, or work with a Social worker.  Ask people who smoke to not smoke around you.  Avoid places that make you want (trigger) to smoke, such as: ? Bars. ? Parties. ? Smoke-break areas at work.  Spend time with people who do not smoke.  Lower the stress in your life. Stress can make you want to smoke. Try these things to help your stress: ? Getting regular exercise. ? Deep-breathing exercises. ? Yoga. ? Meditating. ? Doing a body scan. To do this, close your eyes, focus on one area of your body at a time from head to toe, and notice which parts of your body are tense. Try to relax the muscles in those areas.  Download or buy apps on your mobile phone or tablet that can help you stick to your quit plan. There are many free apps, such as QuitGuide from the [employer] Office manager for Disease  Control and Prevention). You can find more support from smokefree.gov and other websites.  This information is not intended to replace advice given to you by your health care provider. Make sure you discuss any questions you have with your health care provider. Document Released: 05/03/2009 Document Revised: 03/04/2016 Document Reviewed: 11/21/2014 Elsevier Interactive Patient Education  2018 [employer].
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
## 2018-01-28 NOTE — Assessment & Plan Note (Signed)
Concerning for flare; patient to see nerurologist as well as eye doctor; he will call neurologist today for an appointment ASAP

## 2018-01-28 NOTE — Assessment & Plan Note (Signed)
stable °

## 2018-02-12 ENCOUNTER — Other Ambulatory Visit: Payer: Self-pay | Admitting: Family Medicine

## 2018-02-23 ENCOUNTER — Ambulatory Visit: Payer: Medicaid Other | Attending: Family Medicine

## 2018-03-02 ENCOUNTER — Ambulatory Visit: Payer: Medicaid Other

## 2018-03-09 ENCOUNTER — Ambulatory Visit: Payer: Medicaid Other

## 2018-05-24 ENCOUNTER — Other Ambulatory Visit: Payer: Self-pay | Admitting: Neurology

## 2018-05-24 DIAGNOSIS — G35 Multiple sclerosis: Secondary | ICD-10-CM

## 2018-05-24 DIAGNOSIS — R519 Headache, unspecified: Secondary | ICD-10-CM | POA: Insufficient documentation

## 2018-06-07 ENCOUNTER — Ambulatory Visit: Payer: Medicaid Other

## 2018-06-22 ENCOUNTER — Ambulatory Visit
Admission: RE | Admit: 2018-06-22 | Discharge: 2018-06-22 | Disposition: A | Discharge status: Home / Self Care | Payer: Medicaid Other | Source: Ambulatory Visit | Attending: Neurology | Admitting: Neurology

## 2018-06-22 DIAGNOSIS — G35 Multiple sclerosis: Secondary | ICD-10-CM | POA: Diagnosis not present

## 2018-06-22 LAB — POCT I-STAT CREATININE: CREATININE: 1 mg/dL (ref 0.61–1.24)

## 2018-06-22 MED ORDER — GADOBUTROL 1 MMOL/ML IV SOLN
7.5000 mL | Freq: Once | INTRAVENOUS | Status: AC | PRN
  Administered 2018-06-22: 7.5 mL via INTRAVENOUS

## 2018-08-16 ENCOUNTER — Other Ambulatory Visit: Payer: Self-pay | Admitting: Family Medicine

## 2018-09-15 ENCOUNTER — Other Ambulatory Visit: Payer: Self-pay | Admitting: Family Medicine

## 2019-02-28 DIAGNOSIS — R51 Headache: Secondary | ICD-10-CM | POA: Diagnosis not present

## 2019-02-28 DIAGNOSIS — G35 Multiple sclerosis: Secondary | ICD-10-CM | POA: Diagnosis not present

## 2019-02-28 DIAGNOSIS — R569 Unspecified convulsions: Secondary | ICD-10-CM | POA: Diagnosis not present

## 2019-04-15 ENCOUNTER — Other Ambulatory Visit: Payer: Self-pay

## 2019-04-15 ENCOUNTER — Ambulatory Visit: Payer: Medicaid Other | Admitting: Family Medicine

## 2019-04-15 ENCOUNTER — Encounter: Payer: Self-pay | Admitting: Family Medicine

## 2019-04-15 VITALS — BP 124/80 | HR 82 | Temp 97.9°F | Resp 18 | Ht 70.0 in | Wt 175.8 lb

## 2019-04-15 DIAGNOSIS — Z23 Encounter for immunization: Secondary | ICD-10-CM

## 2019-04-15 DIAGNOSIS — Z5181 Encounter for therapeutic drug level monitoring: Secondary | ICD-10-CM | POA: Diagnosis not present

## 2019-04-15 DIAGNOSIS — B001 Herpesviral vesicular dermatitis: Secondary | ICD-10-CM | POA: Diagnosis not present

## 2019-04-15 DIAGNOSIS — J449 Chronic obstructive pulmonary disease, unspecified: Secondary | ICD-10-CM | POA: Diagnosis not present

## 2019-04-15 DIAGNOSIS — I10 Essential (primary) hypertension: Secondary | ICD-10-CM | POA: Diagnosis not present

## 2019-04-15 DIAGNOSIS — R569 Unspecified convulsions: Secondary | ICD-10-CM | POA: Diagnosis not present

## 2019-04-15 DIAGNOSIS — R519 Headache, unspecified: Secondary | ICD-10-CM

## 2019-04-15 DIAGNOSIS — R0689 Other abnormalities of breathing: Secondary | ICD-10-CM | POA: Diagnosis not present

## 2019-04-15 DIAGNOSIS — R51 Headache: Secondary | ICD-10-CM

## 2019-04-15 DIAGNOSIS — F17200 Nicotine dependence, unspecified, uncomplicated: Secondary | ICD-10-CM

## 2019-04-15 MED ORDER — AMLODIPINE BESYLATE 5 MG PO TABS
5.0000 mg | ORAL_TABLET | Freq: Every day | ORAL | 3 refills | Status: DC
Start: 2019-04-15 — End: 2020-08-14

## 2019-04-15 MED ORDER — VALACYCLOVIR HCL 1 G PO TABS: ORAL_TABLET | ORAL | 5 refills | Status: DC

## 2019-04-15 NOTE — Progress Notes (Signed)
Patient ID: Ryan Cannon, male    DOB: [birth year], 53 y.o.   MRN: [id number]  PCP: Arnetha Courser, MD  Chief Complaint  Patient presents with  . Follow-up    Multiple sclerosis  . Migraine  . Hypertension    Subjective:   Ryan Cannon is a 53 y.o. male, presents to clinic with CC of the following:  HPI   Pt presents today - says he doesn't know what for, appt was scheduled through [facility]. He is new to me Hasn't been seen here since July 2019 - 14 months ago He has MS/seizures (?) sees Dr. Manuella Ghazi at [facility].  Past visits with Dr. Sanda Klein show he has hx of HTN, COPD and HSV.   He is a current smoker, no desire to quit COPD - pt denies? Current smoker - 1/2 ppd now, pretty certain that his average smoking hx 32 years No CP, SOB, cough, wheeze - denies bronchitis illness, not had an inhaler   Hypertension:  Pt doesn't know how long ago he was diagnosed Currently on norvasc 5mg , states he is taking as prescribed, no SE or concerns Last Rx for norvasc 5 mg was over a year ago Today blood pressure today is well controlled. BP Readings from Last 3 Encounters:  04/15/19 124/80  01/28/18 (!) 150/94  10/13/16 122/68  Pt denies CP, SOB, exertional sx, LE edema, palpitation, Ha's, visual disturbancesNo lightheadedness, hypotension, syncope. Dietary efforts for BP? Walkning and exercising, weights at home, low salt diet  HA's  Been having frequent daily headaches for at least the last 6-7 years, no hx of migraines before that.  Associated with eye sx, vision in left eye has been "blocked up" foggy and optometry said it wasn't his vision.  He states he is taking over-the-counter medications and BC powders almost every day without improvement he would like to get something better for his headaches.  He says the neurologist has reviewed branch images and showed them all the stuff in his brain and he is concerned about headaches and some something in his eye but is unclear if there is  actual diagnoses of either from the [facility] which she has gone to or from neurology which she sees very frequently.   Patient Active Problem List   Diagnosis Date Noted  . Essential hypertension, benign 01/28/2018  . Hidradenitis suppurativa 10/23/2015  . Herpes simplex labialis 10/23/2015  . Chronic headache 06/07/2015  . Multiple sclerosis ([place]) 06/07/2015  . Seizure ([place]) 06/07/2015  . Chronic pain syndrome 06/07/2015  . Weakness of both lower extremities 06/07/2015  . Encounter for smoking cessation counseling 06/07/2015  . 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  amLODipine (NORVASC) 5 MG tablet, Take 1 tablet (5 mg total) by mouth daily., Disp: 90 tablet, Rfl: 3 .  AVONEX PEN 30 MCG/0.5ML AJKT, INJECT IN THE MUSCLE ONCE WEEKLY UTD, Disp: , Rfl: 11 .  valACYclovir (VALTREX) 1000 MG tablet, TAKE 2 TABLETS BY MOUTH AT ONSET OF FEVER BLISTER AND THEN 2 MORE TABLETS 12 HOURS LATER., Disp: 4 tablet, Rfl: 5 .  Multiple Vitamins-Minerals (MULTIVITAMIN MEN) TABS, Take 1 tablet by mouth daily. , Disp: , Rfl:    No Known Allergies   Family History  Problem Relation Age of Onset  . Cancer Father        lung?  Marland Kitchen Alcohol abuse Father      Social  History   Socioeconomic History  . Marital status: Single    Spouse name: Not on file  . Number of children: Not on file  . Years of education: Not on file  . Highest education level: Not on file  Occupational History  . Not on file  Social Needs  . Financial resource strain: Not on file  . Food insecurity    Worry: Not on file    Inability: Not on file  . Transportation needs    Medical: Not on file    Non-medical: Not on file  Tobacco Use  . Smoking status: Current Every Day Smoker    Types: Cigarettes  . Smokeless tobacco: Never Used  Substance and Sexual Activity  . Alcohol use: Yes    Alcohol/week: 0.0 standard drinks     Comment: ocassionally  . Drug use: Not Currently    Types: Marijuana  . Sexual activity: Yes    Partners: Female  Lifestyle  . Physical activity    Days per week: Not on file    Minutes per session: Not on file  . Stress: Not on file  Relationships  . Social Herbalist on phone: Not on file    Gets together: Not on file    Attends religious service: Not on file    Active member of club or organization: Not on file    Attends meetings of clubs or organizations: Not on file    Relationship status: Not on file  . Intimate partner violence    Fear of current or ex partner: Not on file    Emotionally abused: Not on file    Physically abused: Not on file    Forced sexual activity: Not on file  Other Topics Concern  . Not on file  Social History Narrative  . Not on file    I personally reviewed active problem list, medication list, allergies, lab results with the patient/caregiver today.  Review of Systems  Constitutional: Negative.  Negative for fatigue, fever and unexpected weight change.  HENT: Negative.   Eyes: Negative.   Respiratory: Negative.  Negative for apnea, cough, choking, chest tightness, shortness of breath, wheezing and stridor.   Cardiovascular: Negative.  Negative for chest pain, palpitations and leg swelling.  Gastrointestinal: Negative.   Endocrine: Negative.   Genitourinary: Negative.   Musculoskeletal: Negative.   Skin: Negative.   Allergic/Immunologic: Negative.   Neurological: Negative.   Hematological: Negative.   Psychiatric/Behavioral: Negative.   All other systems reviewed and are negative.      Objective:   Vitals:   04/15/19 1431  BP: 124/80  Pulse: 82  Resp: 18  Temp: 97.9 F (36.6 C)  TempSrc: Oral  SpO2: 96%  Weight: 175 lb 12.8 oz (79.7 kg)  Height: 5\' 10"  (1.778 m)    Body mass index is 25.22 kg/m.  Physical Exam Vitals signs and nursing note reviewed.  Constitutional:      General: He is not in acute  distress.    Appearance: Normal appearance. He is well-developed. He is not ill-appearing, toxic-appearing or diaphoretic.  HENT:     Head: Normocephalic and atraumatic.     Jaw: No trismus.     Right Ear: Tympanic membrane, ear canal and external ear normal.     Left Ear: Tympanic membrane, ear canal and external ear normal.     Nose: Nose normal. No mucosal edema, congestion or rhinorrhea.     Right Sinus: No maxillary sinus tenderness or frontal  sinus tenderness.     Left Sinus: No maxillary sinus tenderness or frontal sinus tenderness.     Mouth/Throat:     Mouth: Mucous membranes are moist.     Pharynx: Oropharynx is clear. Uvula midline. No oropharyngeal exudate, posterior oropharyngeal erythema or uvula swelling.  Eyes:     General: Lids are normal. No scleral icterus.       Right eye: No discharge.        Left eye: No discharge.     Conjunctiva/sclera: Conjunctivae normal.     Pupils: Pupils are equal, round, and reactive to light.  Neck:     Musculoskeletal: Normal range of motion and neck supple.     Trachea: Trachea and phonation normal. No tracheal deviation.  Cardiovascular:     Rate and Rhythm: Normal rate and regular rhythm.     Pulses: Normal pulses.          Radial pulses are 2+ on the right side and 2+ on the left side.       Posterior tibial pulses are 2+ on the right side and 2+ on the left side.     Heart sounds: Normal heart sounds. No murmur. No friction rub. No gallop.   Pulmonary:     Effort: Pulmonary effort is normal. No respiratory distress.     Breath sounds: No stridor. Wheezing and rhonchi present. No rales.  Chest:     Chest wall: No tenderness.  Abdominal:     General: Bowel sounds are normal. There is no distension.     Palpations: Abdomen is soft.     Tenderness: There is no abdominal tenderness. There is no guarding or rebound.  Musculoskeletal: Normal range of motion.     Right lower leg: No edema.     Left lower leg: No edema.  Skin:     General: Skin is warm and dry.     Capillary Refill: Capillary refill takes less than 2 seconds.     Coloration: Skin is not jaundiced or pale.     Findings: No erythema or rash.  Neurological:     Mental Status: He is alert.     Motor: No weakness.     Coordination: Coordination normal.     Gait: Gait normal.  Psychiatric:        Mood and Affect: Mood normal.        Speech: Speech normal.        Behavior: Behavior normal.     Results for orders placed or performed during the hospital encounter of 06/22/18  I-STAT creatinine  Result Value Ref Range   Creatinine, Ser 1.00 0.61 - 1.24 mg/dL        Assessment & Plan:      ICD-10-CM   1. Essential hypertension, benign  N27 COMPLETE METABOLIC PANEL WITH GFR    amLODipine (NORVASC) 5 MG tablet   well controlled, check labs, continue meds and lifestyle efforts  2. 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 Seizure ([place])  R56.9    per neurology Dr. Brigitte Pulse   4. Herpes simplex labialis  B00.1 valACYclovir (VALTREX) 1000 MG tablet   no recent outbreaks, refills given for if he has recurrence  5. Current smoker  F17.200  DG Chest 2 View    Ambulatory referral to Pulmonology   1/2 ppd for 35 years, ~17 pack year hx, advised to quit for health, no desire currently  6. Abnormal breath sounds  R06.89 DG Chest 2 View    Ambulatory referral to Pulmonology  7. Flu vaccine need  Z23 Flu Vaccine MDCK QUAD PF   done today  8. Encounter for medication monitoring  Z51.81 CBC with Differential/Platelet    COMPLETE METABOLIC PANEL WITH GFR    Lipid panel   with HTN, smoking and neuro meds, baseline labs  9. Nonintractable episodic headache, unspecified headache type  R51    6-7 yrs, daily? not previously  discussed with neuro - avoid heavy OTC med use and BC powders - refer back to Dr. Manuella Ghazi    Return for in 3-6 months return for Annual Physical and routine f/up (split visit).     Delsa Grana, PA-C 04/15/19 4:40 PM

## 2019-05-11 ENCOUNTER — Institutional Professional Consult (permissible substitution): Payer: Medicaid Other | Admitting: Pulmonary Disease

## 2019-06-23 ENCOUNTER — Other Ambulatory Visit: Payer: Self-pay

## 2019-06-23 DIAGNOSIS — B001 Herpesviral vesicular dermatitis: Secondary | ICD-10-CM

## 2019-06-24 MED ORDER — VALACYCLOVIR HCL 1 G PO TABS: ORAL_TABLET | ORAL | 5 refills | Status: DC

## 2019-07-19 ENCOUNTER — Encounter: Payer: Medicaid Other | Admitting: Family Medicine

## 2020-01-19 DIAGNOSIS — Z419 Encounter for procedure for purposes other than remedying health state, unspecified: Secondary | ICD-10-CM | POA: Diagnosis not present

## 2020-02-19 DIAGNOSIS — Z419 Encounter for procedure for purposes other than remedying health state, unspecified: Secondary | ICD-10-CM | POA: Diagnosis not present

## 2020-02-28 DIAGNOSIS — G35 Multiple sclerosis: Secondary | ICD-10-CM | POA: Diagnosis not present

## 2020-02-28 DIAGNOSIS — R519 Headache, unspecified: Secondary | ICD-10-CM | POA: Diagnosis not present

## 2020-02-28 DIAGNOSIS — R569 Unspecified convulsions: Secondary | ICD-10-CM | POA: Diagnosis not present

## 2020-03-21 DIAGNOSIS — Z419 Encounter for procedure for purposes other than remedying health state, unspecified: Secondary | ICD-10-CM | POA: Diagnosis not present

## 2020-04-20 DIAGNOSIS — Z419 Encounter for procedure for purposes other than remedying health state, unspecified: Secondary | ICD-10-CM | POA: Diagnosis not present

## 2020-05-21 DIAGNOSIS — Z419 Encounter for procedure for purposes other than remedying health state, unspecified: Secondary | ICD-10-CM | POA: Diagnosis not present

## 2020-06-20 DIAGNOSIS — Z419 Encounter for procedure for purposes other than remedying health state, unspecified: Secondary | ICD-10-CM | POA: Diagnosis not present

## 2020-07-21 DIAGNOSIS — Z419 Encounter for procedure for purposes other than remedying health state, unspecified: Secondary | ICD-10-CM | POA: Diagnosis not present
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
## 2020-08-15 ENCOUNTER — Telehealth: Payer: Self-pay

## 2020-08-15 ENCOUNTER — Ambulatory Visit: Payer: Medicaid Other | Admitting: Family Medicine

## 2020-08-15 ENCOUNTER — Encounter: Payer: Self-pay | Admitting: Family Medicine

## 2020-08-15 ENCOUNTER — Ambulatory Visit
Admission: RE | Admit: 2020-08-15 | Discharge: 2020-08-15 | Disposition: A | Discharge status: Home / Self Care | Payer: Medicaid Other | Attending: Family Medicine | Admitting: Family Medicine

## 2020-08-15 ENCOUNTER — Ambulatory Visit
Admission: RE | Admit: 2020-08-15 | Discharge: 2020-08-15 | Disposition: A | Discharge status: Home / Self Care | Payer: Medicaid Other | Source: Ambulatory Visit | Attending: Family Medicine | Admitting: Family Medicine

## 2020-08-15 ENCOUNTER — Other Ambulatory Visit: Payer: Self-pay

## 2020-08-15 VITALS — BP 124/72 | HR 72 | Temp 98.2°F | Resp 18 | Ht 70.0 in | Wt 188.4 lb

## 2020-08-15 DIAGNOSIS — I10 Essential (primary) hypertension: Secondary | ICD-10-CM

## 2020-08-15 DIAGNOSIS — J449 Chronic obstructive pulmonary disease, unspecified: Secondary | ICD-10-CM

## 2020-08-15 DIAGNOSIS — R0689 Other abnormalities of breathing: Secondary | ICD-10-CM | POA: Diagnosis not present

## 2020-08-15 DIAGNOSIS — R569 Unspecified convulsions: Secondary | ICD-10-CM

## 2020-08-15 DIAGNOSIS — E785 Hyperlipidemia, unspecified: Secondary | ICD-10-CM

## 2020-08-15 DIAGNOSIS — F17200 Nicotine dependence, unspecified, uncomplicated: Secondary | ICD-10-CM

## 2020-08-15 DIAGNOSIS — R519 Headache, unspecified: Secondary | ICD-10-CM | POA: Diagnosis not present

## 2020-08-15 DIAGNOSIS — Z23 Encounter for immunization: Secondary | ICD-10-CM

## 2020-08-15 DIAGNOSIS — Z13220 Encounter for screening for lipoid disorders: Secondary | ICD-10-CM

## 2020-08-15 DIAGNOSIS — L732 Hidradenitis suppurativa: Secondary | ICD-10-CM

## 2020-08-15 DIAGNOSIS — G894 Chronic pain syndrome: Secondary | ICD-10-CM

## 2020-08-15 DIAGNOSIS — B009 Herpesviral infection, unspecified: Secondary | ICD-10-CM | POA: Diagnosis not present

## 2020-08-15 DIAGNOSIS — G35D Multiple sclerosis, unspecified: Secondary | ICD-10-CM

## 2020-08-15 DIAGNOSIS — J4489 Other specified chronic obstructive pulmonary disease: Secondary | ICD-10-CM

## 2020-08-15 DIAGNOSIS — Z5181 Encounter for therapeutic drug level monitoring: Secondary | ICD-10-CM

## 2020-08-15 DIAGNOSIS — G8929 Other chronic pain: Secondary | ICD-10-CM

## 2020-08-15 DIAGNOSIS — G35 Multiple sclerosis: Secondary | ICD-10-CM

## 2020-08-15 DIAGNOSIS — R079 Chest pain, unspecified: Secondary | ICD-10-CM | POA: Diagnosis not present

## 2020-08-15 DIAGNOSIS — R0989 Other specified symptoms and signs involving the circulatory and respiratory systems: Secondary | ICD-10-CM

## 2020-08-15 MED ORDER — ALBUTEROL SULFATE HFA 108 (90 BASE) MCG/ACT IN AERS: 2.0000 | INHALATION_SPRAY | RESPIRATORY_TRACT | 5 refills | Status: DC | PRN

## 2020-08-15 MED ORDER — ADVAIR HFA 115-21 MCG/ACT IN AERO: 2.0000 | INHALATION_SPRAY | Freq: Two times a day (BID) | RESPIRATORY_TRACT | 12 refills | Status: DC

## 2020-08-15 MED ORDER — AZITHROMYCIN 250 MG PO TABS: 250.0000 mg | ORAL_TABLET | Freq: Every day | ORAL | 0 refills | Status: DC

## 2020-08-15 MED ORDER — AMLODIPINE BESYLATE 5 MG PO TABS: 5.0000 mg | ORAL_TABLET | Freq: Every day | ORAL | 3 refills | Status: DC

## 2020-08-15 MED ORDER — PREDNISONE 20 MG PO TABS: 40.0000 mg | ORAL_TABLET | Freq: Every day | ORAL | 0 refills | Status: AC

## 2020-08-15 MED ORDER — VALACYCLOVIR HCL 1 G PO TABS: ORAL_TABLET | ORAL | 5 refills | Status: DC

## 2020-08-16 ENCOUNTER — Other Ambulatory Visit: Payer: Self-pay | Admitting: Family Medicine

## 2020-08-16 DIAGNOSIS — R911 Solitary pulmonary nodule: Secondary | ICD-10-CM

## 2020-08-16 DIAGNOSIS — R079 Chest pain, unspecified: Secondary | ICD-10-CM

## 2020-08-16 DIAGNOSIS — R059 Cough, unspecified: Secondary | ICD-10-CM

## 2020-08-16 DIAGNOSIS — R918 Other nonspecific abnormal finding of lung field: Secondary | ICD-10-CM

## 2020-08-16 LAB — CBC WITH DIFFERENTIAL/PLATELET
Absolute Monocytes: 703 cells/uL (ref 200–950)
Basophils Absolute: 29 cells/uL (ref 0–200)
Basophils Relative: 0.3 %
Eosinophils Absolute: 162 cells/uL (ref 15–500)
Eosinophils Relative: 1.7 %
HCT: 46.1 % (ref 38.5–50.0)
Hemoglobin: 15.7 g/dL (ref 13.2–17.1)
Lymphs Abs: 1748 cells/uL (ref 850–3900)
MCH: 30.1 pg (ref 27.0–33.0)
MCHC: 34.1 g/dL (ref 32.0–36.0)
MCV: 88.5 fL (ref 80.0–100.0)
MPV: 12 fL (ref 7.5–12.5)
Monocytes Relative: 7.4 %
Neutro Abs: 6859 cells/uL (ref 1500–7800)
Neutrophils Relative %: 72.2 %
Platelets: 268 10*3/uL (ref 140–400)
RBC: 5.21 10*6/uL (ref 4.20–5.80)
RDW: 13.6 % (ref 11.0–15.0)
Total Lymphocyte: 18.4 %
WBC: 9.5 10*3/uL (ref 3.8–10.8)

## 2020-08-16 LAB — COMPLETE METABOLIC PANEL WITH GFR
AG Ratio: 1.3 (calc) (ref 1.0–2.5)
ALT: 17 U/L (ref 9–46)
AST: 22 U/L (ref 10–35)
Albumin: 4.4 g/dL (ref 3.6–5.1)
Alkaline phosphatase (APISO): 51 U/L (ref 35–144)
BUN: 10 mg/dL (ref 7–25)
CO2: 29 mmol/L (ref 20–32)
Calcium: 9.6 mg/dL (ref 8.6–10.3)
Chloride: 105 mmol/L (ref 98–110)
Creat: 1.05 mg/dL (ref 0.70–1.33)
GFR, Est African American: 92 mL/min/{1.73_m2} (ref >=60)
GFR, Est Non African American: 79 mL/min/{1.73_m2} (ref >=60)
Globulin: 3.4 g/dL (calc) (ref 1.9–3.7)
Glucose, Bld: 89 mg/dL (ref 65–99)
Potassium: 4.2 mmol/L (ref 3.5–5.3)
Sodium: 139 mmol/L (ref 135–146)
Total Bilirubin: 0.4 mg/dL (ref 0.2–1.2)
Total Protein: 7.8 g/dL (ref 6.1–8.1)

## 2020-08-16 LAB — LIPID PANEL
Cholesterol: 162 mg/dL (ref <200)
HDL: 43 mg/dL (ref >=40)
LDL Cholesterol (Calc): 102 mg/dL (calc) — ABNORMAL HIGH
Non-HDL Cholesterol (Calc): 119 mg/dL (calc) (ref <130)
Total CHOL/HDL Ratio: 3.8 (calc) (ref <5.0)
Triglycerides: 83 mg/dL (ref <150)

## 2020-08-16 MED ORDER — ATORVASTATIN CALCIUM 20 MG PO TABS: 20.0000 mg | ORAL_TABLET | Freq: Every day | ORAL | 3 refills | Status: DC

## 2020-08-16 NOTE — Addendum Note (Signed)
Addended by: Delsa Grana on: 08/16/2020 01:26 PM   Modules accepted: Orders

## 2020-08-21 DIAGNOSIS — Z419 Encounter for procedure for purposes other than remedying health state, unspecified: Secondary | ICD-10-CM | POA: Diagnosis not present

## 2020-08-24 NOTE — Telephone Encounter (Signed)
done

## 2020-09-03 ENCOUNTER — Ambulatory Visit: Admission: RE | Admit: 2020-09-03 | Payer: Medicaid Other | Source: Ambulatory Visit

## 2020-09-18 ENCOUNTER — Other Ambulatory Visit: Payer: Self-pay

## 2020-09-18 ENCOUNTER — Encounter: Payer: Medicaid Other | Admitting: Pulmonary Disease

## 2020-09-18 DIAGNOSIS — Z419 Encounter for procedure for purposes other than remedying health state, unspecified: Secondary | ICD-10-CM | POA: Diagnosis not present

## 2020-09-27 DIAGNOSIS — R569 Unspecified convulsions: Secondary | ICD-10-CM | POA: Diagnosis not present

## 2020-09-27 DIAGNOSIS — E538 Deficiency of other specified B group vitamins: Secondary | ICD-10-CM | POA: Diagnosis not present

## 2020-09-27 DIAGNOSIS — G35 Multiple sclerosis: Secondary | ICD-10-CM | POA: Diagnosis not present

## 2020-09-27 DIAGNOSIS — E559 Vitamin D deficiency, unspecified: Secondary | ICD-10-CM | POA: Diagnosis not present

## 2020-09-28 ENCOUNTER — Other Ambulatory Visit: Payer: Self-pay | Admitting: Neurology

## 2020-09-28 DIAGNOSIS — G8929 Other chronic pain: Secondary | ICD-10-CM

## 2020-09-28 DIAGNOSIS — R519 Headache, unspecified: Secondary | ICD-10-CM

## 2020-10-15 ENCOUNTER — Ambulatory Visit
Admission: RE | Admit: 2020-10-15 | Discharge: 2020-10-15 | Disposition: A | Discharge status: Home / Self Care | Payer: Medicaid Other | Source: Ambulatory Visit | Attending: Neurology | Admitting: Neurology

## 2020-10-15 ENCOUNTER — Other Ambulatory Visit: Payer: Self-pay

## 2020-10-15 DIAGNOSIS — R519 Headache, unspecified: Secondary | ICD-10-CM | POA: Diagnosis present

## 2020-10-15 DIAGNOSIS — G35 Multiple sclerosis: Secondary | ICD-10-CM | POA: Diagnosis not present

## 2020-10-15 DIAGNOSIS — G8929 Other chronic pain: Secondary | ICD-10-CM | POA: Insufficient documentation

## 2020-10-15 DIAGNOSIS — M50221 Other cervical disc displacement at C4-C5 level: Secondary | ICD-10-CM | POA: Diagnosis not present

## 2020-10-15 DIAGNOSIS — G9389 Other specified disorders of brain: Secondary | ICD-10-CM | POA: Diagnosis not present

## 2020-10-15 DIAGNOSIS — M4802 Spinal stenosis, cervical region: Secondary | ICD-10-CM | POA: Diagnosis not present

## 2020-10-15 DIAGNOSIS — M47812 Spondylosis without myelopathy or radiculopathy, cervical region: Secondary | ICD-10-CM | POA: Diagnosis not present

## 2020-10-15 MED ORDER — GADOBUTROL 1 MMOL/ML IV SOLN
9.0000 mL | Freq: Once | INTRAVENOUS | Status: AC | PRN
  Administered 2020-10-15: 9 mL via INTRAVENOUS

## 2020-10-15 MED ORDER — GADOBUTROL 1 MMOL/ML IV SOLN: 8.0000 mL | Freq: Once | INTRAVENOUS | Status: DC | PRN

## 2020-10-19 DIAGNOSIS — Z419 Encounter for procedure for purposes other than remedying health state, unspecified: Secondary | ICD-10-CM | POA: Diagnosis not present

## 2020-11-06 DIAGNOSIS — G35 Multiple sclerosis: Secondary | ICD-10-CM | POA: Diagnosis not present

## 2020-11-06 DIAGNOSIS — G4489 Other headache syndrome: Secondary | ICD-10-CM | POA: Diagnosis not present

## 2020-11-13 ENCOUNTER — Ambulatory Visit: Payer: Medicaid Other | Admitting: Family Medicine

## 2020-11-13 DIAGNOSIS — I10 Essential (primary) hypertension: Secondary | ICD-10-CM

## 2020-11-13 DIAGNOSIS — J449 Chronic obstructive pulmonary disease, unspecified: Secondary | ICD-10-CM

## 2020-11-13 DIAGNOSIS — Z5181 Encounter for therapeutic drug level monitoring: Secondary | ICD-10-CM

## 2020-11-14 ENCOUNTER — Telehealth: Payer: Self-pay | Admitting: Family Medicine

## 2020-11-14 NOTE — Telephone Encounter (Signed)
I connected with [name] today to get him scheduled with the MM Team but he declined our services.

## 2021-03-12 DIAGNOSIS — R569 Unspecified convulsions: Secondary | ICD-10-CM | POA: Diagnosis not present

## 2021-03-12 DIAGNOSIS — R519 Headache, unspecified: Secondary | ICD-10-CM | POA: Diagnosis not present

## 2021-03-12 DIAGNOSIS — Z114 Encounter for screening for human immunodeficiency virus [HIV]: Secondary | ICD-10-CM | POA: Diagnosis not present

## 2021-03-12 DIAGNOSIS — E559 Vitamin D deficiency, unspecified: Secondary | ICD-10-CM | POA: Diagnosis not present

## 2021-03-12 DIAGNOSIS — G35 Multiple sclerosis: Secondary | ICD-10-CM | POA: Diagnosis not present

## 2021-03-12 DIAGNOSIS — R2681 Unsteadiness on feet: Secondary | ICD-10-CM | POA: Diagnosis not present

## 2021-08-02 ENCOUNTER — Emergency Department: Payer: Medicaid Other

## 2021-08-02 ENCOUNTER — Other Ambulatory Visit: Payer: Self-pay

## 2021-08-02 ENCOUNTER — Emergency Department
Admission: EM | Admit: 2021-08-02 | Discharge: 2021-08-02 | Disposition: A | Discharge status: Home / Self Care | Payer: Medicaid Other | Attending: Emergency Medicine | Admitting: Emergency Medicine

## 2021-08-02 DIAGNOSIS — C799 Secondary malignant neoplasm of unspecified site: Secondary | ICD-10-CM | POA: Insufficient documentation

## 2021-08-02 DIAGNOSIS — R918 Other nonspecific abnormal finding of lung field: Secondary | ICD-10-CM | POA: Diagnosis not present

## 2021-08-02 DIAGNOSIS — M25511 Pain in right shoulder: Secondary | ICD-10-CM | POA: Diagnosis present

## 2021-08-02 DIAGNOSIS — F17210 Nicotine dependence, cigarettes, uncomplicated: Secondary | ICD-10-CM | POA: Diagnosis not present

## 2021-08-02 LAB — CBC
HCT: 46.1 % (ref 39.0–52.0)
Hemoglobin: 14.9 g/dL (ref 13.0–17.0)
MCH: 28.7 pg (ref 26.0–34.0)
MCHC: 32.3 g/dL (ref 30.0–36.0)
MCV: 88.7 fL (ref 80.0–100.0)
Platelets: 326 10*3/uL (ref 150–400)
RBC: 5.2 MIL/uL (ref 4.22–5.81)
RDW: 16.5 % — ABNORMAL HIGH (ref 11.5–15.5)
WBC: 10.5 10*3/uL (ref 4.0–10.5)
nRBC: 0 % (ref 0.0–0.2)

## 2021-08-02 LAB — BASIC METABOLIC PANEL
Anion gap: 10 (ref 5–15)
BUN: 12 mg/dL (ref 6–20)
CO2: 25 mmol/L (ref 22–32)
Calcium: 9.3 mg/dL (ref 8.9–10.3)
Chloride: 104 mmol/L (ref 98–111)
Creatinine, Ser: 0.95 mg/dL (ref 0.61–1.24)
GFR, Estimated: >60 mL/min (ref >60)
Glucose, Bld: 103 mg/dL — ABNORMAL HIGH (ref 70–99)
Potassium: 4 mmol/L (ref 3.5–5.1)
Sodium: 139 mmol/L (ref 135–145)

## 2021-08-02 LAB — D-DIMER, QUANTITATIVE: D-Dimer, Quant: 0.68 ug/mL-FEU — ABNORMAL HIGH (ref 0.00–0.50)

## 2021-08-02 LAB — TROPONIN I (HIGH SENSITIVITY): Troponin I (High Sensitivity): 10 ng/L (ref <18)

## 2021-08-02 MED ORDER — IOHEXOL 350 MG/ML SOLN
75.0000 mL | Freq: Once | INTRAVENOUS | Status: AC | PRN
  Administered 2021-08-02: 75 mL via INTRAVENOUS
  Filled 2021-08-02: qty 75

## 2021-08-02 MED ORDER — HYDROCODONE-ACETAMINOPHEN 5-325 MG PO TABS
1.0000 | ORAL_TABLET | Freq: Once | ORAL | Status: AC
  Administered 2021-08-02: 1 via ORAL
  Filled 2021-08-02: qty 1

## 2021-08-02 MED ORDER — OXYCODONE HCL 5 MG PO TABS
5.0000 mg | ORAL_TABLET | Freq: Four times a day (QID) | ORAL | 0 refills | Status: AC | PRN
Start: 2021-08-02 — End: 2021-08-07

## 2021-08-02 NOTE — ED Provider Notes (Signed)
[facility] Provider Note    Event Date/Time   First MD Initiated Contact with Patient 08/02/21 1103     (approximate)   History   Shoulder Pain   HPI  Ryan Cannon is a 57 y.o. male  presents to the emergency department for treatment and evaluation of right shoulder pain. Symptoms started about a week ago. No known injury. History of MS without new weakness. No relief with OTC medications. Pain increases with movement and deep breath. No history of PE or DVT. Smokes cigarettes.       Physical Exam   Triage Vital Signs: Today's Vitals   08/02/21 1215 08/02/21 1320 08/02/21 1536 08/02/21 1536  BP:    105/72  Pulse:    74  Resp:    19  Temp:    98.2 F (36.8 C)  TempSrc:    Oral  SpO2:    97%  Weight: 85.5 kg     Height: 5\' 10"  (1.778 m)     PainSc: 10-Worst pain ever 4  5     Body mass index is 27.05 kg/m.    Most recent vital signs: Vitals:   08/02/21 1132 08/02/21 1536  BP: 101/79 105/72  Pulse: 89 74  Resp: 16 19  Temp: (!) 97.5 F (36.4 C) 98.2 F (36.8 C)  SpO2: 95% 97%     General: Awake, no distress.  CV:  Good peripheral perfusion.  Resp:  Normal effort.  Abd:  No distention.  Other:  Right shoulder pain increases with abduction and external rotation and radiates into right subscapular area.   ED Results / Procedures / Treatments   Labs (all labs ordered are listed, but only abnormal results are displayed) Labs Reviewed  CBC - Abnormal; Notable for the following components:      Result Value   RDW 16.5 (*)    All other components within normal limits  D-DIMER, QUANTITATIVE - Abnormal; Notable for the following components:   D-Dimer, Quant 0.68 (*)    All other components within normal limits  BASIC METABOLIC PANEL - Abnormal; Notable for the following components:   Glucose, Bld 103 (*)    All other components within normal limits  TROPONIN I (HIGH SENSITIVITY)      EKG  Pending   RADIOLOGY Pending   PROCEDURES:  Critical Care performed: No  Procedures   MEDICATIONS ORDERED IN ED: Medications  HYDROcodone-acetaminophen (NORCO/VICODIN) 5-325 MG per tablet 1 tablet (1 tablet Oral Given 08/02/21 1217)  iohexol (OMNIPAQUE) 350 MG/ML injection 75 mL (75 mLs Intravenous Contrast Given 08/02/21 1325)     IMPRESSION / MDM / ASSESSMENT AND PLAN / ED COURSE  I reviewed the triage vital signs and the nursing notes.                              Differential diagnosis includes, but is not limited to, musculoskeletal pain, PE, pneumonia   56 year old male presents for right shoulder pain with radiation into back and right thorax. See HPI for further details. Plan will be to get some labs and chest x-ray and medicate for pain.   Chest x-ray with concern bilateral lung masses. Will order CT with contrast for further evaluation.  Care transferred to Dr. Jari Pigg who will follow to disposition.      FINAL CLINICAL IMPRESSION(S) / ED DIAGNOSES   Final diagnoses:  Lung mass  Metastatic malignant neoplasm, unspecified site (  [place])     Rx / DC Orders   ED Discharge Orders          Ordered    oxyCODONE (ROXICODONE) 5 MG immediate release tablet  Every 6 hours PRN        08/02/21 1526             Note:  This document was prepared using Dragon voice recognition software and may include unintentional dictation errors.   Ryan Dike, Ryan Cannon 08/02/21 1541    Vanessa [place], MD 08/02/21 [phone]

## 2021-08-02 NOTE — Discharge Instructions (Addendum)
Your CT scan is as below.  I have discussed with the oncology team who should call you to make an appointment but if you not hear from them their numbers listed above.  You can take the pain medication to help with the pain until then.  Try to use some over-the-counter MiraLAX to help prevent constipation  Take oxycodone as prescribed. Do not drink alcohol, drive or participate in any other potentially dangerous activities while taking this medication as it may make you sleepy. Do not take this medication with any other sedating medications, either prescription or over-the-counter. If you were prescribed Percocet or Vicodin, do not take these with acetaminophen (Tylenol) as it is already contained within these medications.  This medication is an opiate (or narcotic) pain medication and can be habit forming. Use it as little as possible to achieve adequate pain control. Do not use or use it with extreme caution if you have a history of opiate abuse or dependence. If you are on a pain contract with your primary care doctor or a pain specialist, be sure to let them know you were prescribed this medication today from the Emergency Department. This medication is intended for your use only - do not give any to anyone else and keep it in a secure place where nobody else, especially children, have access to it.    IMPRESSION:  Masslike consolidations in the left upper lobe and right  middle/upper lobe, measuring 5.3 x 2.5 cm and 4.5 x 4.0 cm  respectively, these are highly suspicious for neoplasm, possibly  metastases with indeterminate primary. Multifocal infection is  possible but felt to be less likely.     Lytic destructive lesion involving the inferior angle of the right  scapula, concerning for a metastasis.     Postsurgical changes of the left chest wall with partial left sixth  rib resection and adjacent isodense mass favored to represent a  muscular flap reconstruction of the chest wall and less  likely a  chest wall mass. Correlate with surgical history.     Indeterminate hypodense right hepatic lobe lesion measuring 1.1 cm.  Recommend contrast enhanced CT of the abdomen and pelvis for further  evaluation.     No evidence of pulmonary embolism.     These results were called by telephone at the time of interpretation  on 08/02/2021 at 2:17 pm to provider Dr. Jari Pigg, who verbally  acknowledged these results.

## 2021-08-02 NOTE — ED Triage Notes (Signed)
Pt c/o right shoulder pain for the past 3-4 days , states it hurts to move or lift it, denies injury,

## 2021-08-02 NOTE — ED Provider Notes (Signed)
Patient CT concerning for metastatic cancer.  Patient is aware of this.  I recommended CT abdomen pelvis patient declined stated that he has taken leave today.  I discussed the case with oncology Dr. Rogue Bussing will get patient follow-up for next week.  He stated that they will plan to do a PET scan probably.  Patient will be scribed some oxycodone to help with pain.  Suspect he is having pain from his lytic lesion on his scapula given he is point tender over this area.  He understands not to drive while on this.     Vanessa Omao, MD 08/02/21 1530

## 2021-08-05 ENCOUNTER — Other Ambulatory Visit: Payer: Self-pay

## 2021-08-05 ENCOUNTER — Telehealth: Payer: Self-pay

## 2021-08-05 ENCOUNTER — Encounter: Payer: Self-pay | Admitting: *Deleted

## 2021-08-05 ENCOUNTER — Inpatient Hospital Stay: Payer: Medicaid Other | Attending: Internal Medicine | Admitting: Internal Medicine

## 2021-08-05 ENCOUNTER — Inpatient Hospital Stay: Payer: Medicaid Other

## 2021-08-05 ENCOUNTER — Encounter: Payer: Self-pay | Admitting: Internal Medicine

## 2021-08-05 VITALS — BP 132/101 | HR 93 | Temp 93.7°F | Ht 70.0 in | Wt 176.5 lb

## 2021-08-05 DIAGNOSIS — C3411 Malignant neoplasm of upper lobe, right bronchus or lung: Secondary | ICD-10-CM | POA: Insufficient documentation

## 2021-08-05 DIAGNOSIS — K769 Liver disease, unspecified: Secondary | ICD-10-CM | POA: Diagnosis not present

## 2021-08-05 DIAGNOSIS — R918 Other nonspecific abnormal finding of lung field: Secondary | ICD-10-CM | POA: Diagnosis not present

## 2021-08-05 DIAGNOSIS — R634 Abnormal weight loss: Secondary | ICD-10-CM | POA: Diagnosis not present

## 2021-08-05 DIAGNOSIS — G35 Multiple sclerosis: Secondary | ICD-10-CM | POA: Insufficient documentation

## 2021-08-05 DIAGNOSIS — C3490 Malignant neoplasm of unspecified part of unspecified bronchus or lung: Secondary | ICD-10-CM

## 2021-08-05 DIAGNOSIS — M25511 Pain in right shoulder: Secondary | ICD-10-CM | POA: Insufficient documentation

## 2021-08-05 DIAGNOSIS — C3412 Malignant neoplasm of upper lobe, left bronchus or lung: Secondary | ICD-10-CM | POA: Diagnosis not present

## 2021-08-05 DIAGNOSIS — F17210 Nicotine dependence, cigarettes, uncomplicated: Secondary | ICD-10-CM | POA: Insufficient documentation

## 2021-08-05 DIAGNOSIS — M899 Disorder of bone, unspecified: Secondary | ICD-10-CM | POA: Diagnosis not present

## 2021-08-05 DIAGNOSIS — C7951 Secondary malignant neoplasm of bone: Secondary | ICD-10-CM | POA: Diagnosis not present

## 2021-08-05 DIAGNOSIS — Z79899 Other long term (current) drug therapy: Secondary | ICD-10-CM | POA: Diagnosis not present

## 2021-08-05 DIAGNOSIS — I10 Essential (primary) hypertension: Secondary | ICD-10-CM | POA: Diagnosis not present

## 2021-08-05 MED ORDER — OXYCODONE-ACETAMINOPHEN 10-325 MG PO TABS: 1.0000 | ORAL_TABLET | Freq: Four times a day (QID) | ORAL | 0 refills | Status: DC | PRN

## 2021-08-05 NOTE — Telephone Encounter (Signed)
Prior authorization for Oxycodone/Acetaminophen 10-325 mg submitted viat Cover My Meds (Key: [id number])
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
## 2021-08-05 NOTE — Assessment & Plan Note (Addendum)
#  Bilateral lung masses - left upper lobe [5.3 x 2.5 cm ] and right middle/upper lobe [4.5 x 4.0 cm]- highly suspicious for neoplasm, possibly metastases with indeterminate primary. Lytic destructive lesion involving the inferior angle of the right scapula, concerning for a metastasis.Indeterminate hypodense right hepatic lobe lesion measuring 1.1 cm. Discussed with the patient at length that above Imaging findings highly concerning for malignancy.  #Recommend PET scan for further evaluation/bone lesions.  Patient will need  biopsy for confirmation of malignancy /NGS testing.  Nurse navigator to reach out to IR regarding biopsy of the scapular lesion.; planned on 1/26.   #If malignancy is confirmed patient-need chemotherapy; also recommend NGS.  Question candidate for immunotherapy given patient's multiple sclerosis.  Will discuss with neurology. Patient also need brain MRI-given the risk of metastasis to brain.  # Pain right scapula-poorly controlled on oxycodone 5 mg every 6 hours.  Would increase to on oxycodone to 10 mg every 6 hours.  Will refer to radiation oncology ASAP.  Consider Zometa.  # Smoking: Recommended patient quit smoking.   Thank you Dr.Funke for allowing me to participate in the care of your pleasant patient. Please do not hesitate to contact me with questions or concerns in the interim.  Discussed with [facility].  # I reviewed the blood work- with the patient in detail; also reviewed the imaging independently [as summarized above]; and with the patient in detail.   *pt cant drive # DISPOSITION: # Please add sister to contact-  #  No labs today # PET STAT # referral to radiation ASAP re: lung cancer; scapular lesion # follow up in 12-days after the PET scan-labs-cbc/cmp;LDH-  Dr.B

## 2021-08-05 NOTE — Progress Notes (Signed)
Per Dr. Rogue Bussing, pt needs biopsy of the right scapula lytic lesion if possible by interventional radiology. Will assist in coordinating appt for patient and notify him of appt once scheduled.

## 2021-08-06 NOTE — Telephone Encounter (Signed)
PA has been approved through 02/02/2022.  Notification faxed to [place]. [place] [phone]).

## 2021-08-07 ENCOUNTER — Encounter: Payer: Self-pay | Admitting: *Deleted

## 2021-08-07 ENCOUNTER — Other Ambulatory Visit: Payer: Self-pay | Admitting: Family Medicine

## 2021-08-07 DIAGNOSIS — I10 Essential (primary) hypertension: Secondary | ICD-10-CM

## 2021-08-08 ENCOUNTER — Encounter: Payer: Self-pay | Admitting: *Deleted

## 2021-08-08 ENCOUNTER — Telehealth: Payer: Self-pay | Admitting: *Deleted

## 2021-08-08 ENCOUNTER — Other Ambulatory Visit: Payer: Medicaid Other

## 2021-08-08 NOTE — Telephone Encounter (Signed)
Barbara from scheduling called to state she was unable to reach [facility] to inform her of patient appointment for 1/26 for his biopsy. Arrival time of 9 AM or 10 AM biopsy appointment.

## 2021-08-08 NOTE — Progress Notes (Signed)
Called pt to review changes to upcoming appts next week. Pt not available at the time of the call. Pt's mother took message regarding appts. Informed that will meet with him at his next visit with Dr. Baruch Gouty on Monday 08/12/21 to further review appts. Contact info given and instructed to call with any questions or needs. Pt's mother verbalized understanding.

## 2021-08-08 NOTE — Progress Notes (Signed)
Tumor Board Documentation  Ryan Cannon was presented by Dr Rogue Bussing at our Tumor Board on 08/08/2021, which included representatives from medical oncology, radiology, pathology, surgical, pharmacy, pulmonology, genetics, nutrition, navigation, research, internal medicine, palliative care.  Ryan Cannon currently presents as a new patient, for discussion with history of the following treatments: active survellience.  Additionally, we reviewed previous medical and familial history, history of present illness, and recent lab results along with all available histopathologic and imaging studies. The tumor board considered available treatment options and made the following recommendations: Biopsy, Radiation therapy (primary modality) (PET Scan, MRI Brain) Possible Zometa, possible chemo and or Immunotherapy depending on bipsy results  The following procedures/referrals were also placed: No orders of the defined types were placed in this encounter.   Clinical Trial Status: not discussed   Staging used: To be determined, Clinical Stage AJCC Staging:       Group: Lung Cancer   National site-specific guidelines   were discussed with respect to the case.  Tumor board is a meeting of clinicians from various specialty areas who evaluate and discuss patients for whom a multidisciplinary approach is being considered. Final determinations in the plan of care are those of the provider(s). The responsibility for follow up of recommendations given during tumor board is that of the provider.   Todays extended care, comprehensive team conference, [name] was not present for the discussion and was not examined.   Multidisciplinary Tumor Board is a multidisciplinary case peer review process.  Decisions discussed in the Multidisciplinary Tumor Board reflect the opinions of the specialists present at the conference without having examined the patient.  Ultimately, treatment and diagnostic decisions rest with the  primary provider(s) and the patient.

## 2021-08-08 NOTE — Telephone Encounter (Signed)
Great! Thank you. I will inform the patient.

## 2021-08-12 ENCOUNTER — Encounter: Payer: Self-pay | Admitting: Radiation Oncology

## 2021-08-12 ENCOUNTER — Inpatient Hospital Stay (HOSPITAL_BASED_OUTPATIENT_CLINIC_OR_DEPARTMENT_OTHER): Payer: Medicaid Other | Admitting: Hospice and Palliative Medicine

## 2021-08-12 ENCOUNTER — Other Ambulatory Visit: Payer: Self-pay

## 2021-08-12 ENCOUNTER — Ambulatory Visit
Admission: RE | Admit: 2021-08-12 | Discharge: 2021-08-12 | Disposition: A | Discharge status: Home / Self Care | Payer: Medicaid Other | Source: Ambulatory Visit | Attending: Radiation Oncology | Admitting: Radiation Oncology

## 2021-08-12 ENCOUNTER — Encounter: Payer: Self-pay | Admitting: *Deleted

## 2021-08-12 VITALS — BP 143/83 | HR 92 | Temp 98.4°F | Resp 20 | Wt 170.8 lb

## 2021-08-12 DIAGNOSIS — C3480 Malignant neoplasm of overlapping sites of unspecified bronchus and lung: Secondary | ICD-10-CM

## 2021-08-12 DIAGNOSIS — G893 Neoplasm related pain (acute) (chronic): Secondary | ICD-10-CM | POA: Diagnosis not present

## 2021-08-12 DIAGNOSIS — C3412 Malignant neoplasm of upper lobe, left bronchus or lung: Secondary | ICD-10-CM | POA: Diagnosis not present

## 2021-08-12 MED ORDER — OXYCODONE HCL 10 MG PO TABS: 10.0000 mg | ORAL_TABLET | ORAL | 0 refills | Status: DC | PRN

## 2021-08-12 MED ORDER — DEXAMETHASONE 4 MG PO TABS: 4.0000 mg | ORAL_TABLET | Freq: Every day | ORAL | 0 refills | Status: DC
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
## 2021-08-12 NOTE — Progress Notes (Signed)
Met with patient after initial consult with Dr. Baruch Gouty. All questions answered during visit. Reviewed upcoming appts. Contact info given and instructed to call with any questions or needs. Pt verbalized understanding.
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
## 2021-08-13 ENCOUNTER — Encounter
Admission: RE | Admit: 2021-08-13 | Discharge: 2021-08-13 | Disposition: A | Discharge status: Home / Self Care | Payer: Medicaid Other | Source: Ambulatory Visit | Attending: Internal Medicine | Admitting: Internal Medicine

## 2021-08-13 ENCOUNTER — Telehealth: Payer: Self-pay | Admitting: *Deleted

## 2021-08-13 DIAGNOSIS — C3490 Malignant neoplasm of unspecified part of unspecified bronchus or lung: Secondary | ICD-10-CM | POA: Insufficient documentation

## 2021-08-13 LAB — GLUCOSE, CAPILLARY: Glucose-Capillary: 98 mg/dL (ref 70–99)

## 2021-08-13 MED ORDER — FLUDEOXYGLUCOSE F - 18 (FDG) INJECTION
9.3900 | Freq: Once | INTRAVENOUS | Status: AC | PRN
  Administered 2021-08-13: 13:00:00 9.39 via INTRAVENOUS

## 2021-08-13 NOTE — Telephone Encounter (Signed)
Via fax-[facility] pharmacy notified that script was approved by insurance.

## 2021-08-13 NOTE — Telephone Encounter (Signed)
[place] [place] ([place]: [id number]) [id number] oxyCODONE HCl 10MG  tablets     Status: PA Response - Approved

## 2021-08-13 NOTE — Telephone Encounter (Signed)
[place] [place]: [id number] - PA Case ID: [id number] - Rx #: [id number]   PA submitted via cover my meds. Waiting on insurance approval.

## 2021-08-14 ENCOUNTER — Other Ambulatory Visit: Payer: Self-pay | Admitting: Student

## 2021-08-14 ENCOUNTER — Other Ambulatory Visit: Payer: Medicaid Other

## 2021-08-14 ENCOUNTER — Other Ambulatory Visit: Payer: Self-pay | Admitting: Radiology

## 2021-08-14 ENCOUNTER — Ambulatory Visit
Admission: RE | Admit: 2021-08-14 | Discharge: 2021-08-14 | Disposition: A | Discharge status: Home / Self Care | Payer: Medicaid Other | Source: Ambulatory Visit | Attending: Radiation Oncology | Admitting: Radiation Oncology

## 2021-08-14 DIAGNOSIS — C7951 Secondary malignant neoplasm of bone: Secondary | ICD-10-CM | POA: Diagnosis present

## 2021-08-14 DIAGNOSIS — C3490 Malignant neoplasm of unspecified part of unspecified bronchus or lung: Secondary | ICD-10-CM | POA: Insufficient documentation

## 2021-08-14 DIAGNOSIS — Z510 Encounter for antineoplastic radiation therapy: Secondary | ICD-10-CM | POA: Insufficient documentation

## 2021-08-15 ENCOUNTER — Ambulatory Visit: Payer: Medicaid Other

## 2021-08-15 ENCOUNTER — Other Ambulatory Visit: Payer: Self-pay

## 2021-08-15 ENCOUNTER — Ambulatory Visit
Admission: RE | Admit: 2021-08-15 | Discharge: 2021-08-15 | Disposition: A | Discharge status: Home / Self Care | Payer: Medicaid Other | Source: Ambulatory Visit | Attending: Internal Medicine | Admitting: Internal Medicine

## 2021-08-15 DIAGNOSIS — C7951 Secondary malignant neoplasm of bone: Secondary | ICD-10-CM | POA: Insufficient documentation

## 2021-08-15 DIAGNOSIS — R918 Other nonspecific abnormal finding of lung field: Secondary | ICD-10-CM | POA: Diagnosis present

## 2021-08-15 MED ORDER — FENTANYL CITRATE (PF) 100 MCG/2ML IJ SOLN
INTRAMUSCULAR | Status: AC | PRN
  Administered 2021-08-15 (×2): 50 ug via INTRAVENOUS

## 2021-08-15 MED ORDER — MIDAZOLAM HCL 2 MG/2ML IJ SOLN
INTRAMUSCULAR | Status: AC
  Filled 2021-08-15: qty 4

## 2021-08-15 MED ORDER — HYDROCODONE-ACETAMINOPHEN 5-325 MG PO TABS: 1.0000 | ORAL_TABLET | ORAL | Status: DC | PRN

## 2021-08-15 MED ORDER — MIDAZOLAM HCL 2 MG/2ML IJ SOLN
INTRAMUSCULAR | Status: AC | PRN
  Administered 2021-08-15 (×2): 1 mg via INTRAVENOUS

## 2021-08-15 MED ORDER — FENTANYL CITRATE PF 50 MCG/ML IJ SOSY
25.0000 ug | PREFILLED_SYRINGE | Freq: Once | INTRAMUSCULAR | Status: AC
  Administered 2021-08-15: 25 ug via INTRAVENOUS

## 2021-08-15 MED ORDER — FENTANYL CITRATE (PF) 100 MCG/2ML IJ SOLN
INTRAMUSCULAR | Status: AC
  Filled 2021-08-15: qty 2

## 2021-08-15 MED ORDER — SODIUM CHLORIDE 0.9 % IV SOLN: INTRAVENOUS | Status: DC
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
## 2021-08-15 NOTE — Procedures (Signed)
Interventional Radiology Procedure:   Indications: Lytic lesion of right scapula.  Lung masses.  Needs tissue diagnosis  Procedure: CT guided core biopsies of right scapula lytic / soft tissue lesion  Findings: Multiple cores obtained from the soft tissue component of the lytic bone lesion  Complications: No immediate complications noted.     EBL: Minimal  Plan: Discharge to home in 1 hour   Rand Etchison R. Anselm Pancoast, MD  Pager: [phone]

## 2021-08-16 ENCOUNTER — Other Ambulatory Visit: Payer: Medicaid Other

## 2021-08-16 ENCOUNTER — Inpatient Hospital Stay (HOSPITAL_BASED_OUTPATIENT_CLINIC_OR_DEPARTMENT_OTHER): Payer: Medicaid Other | Admitting: Hospice and Palliative Medicine

## 2021-08-16 ENCOUNTER — Telehealth: Payer: Self-pay | Admitting: *Deleted

## 2021-08-16 DIAGNOSIS — G893 Neoplasm related pain (acute) (chronic): Secondary | ICD-10-CM

## 2021-08-16 DIAGNOSIS — Z510 Encounter for antineoplastic radiation therapy: Secondary | ICD-10-CM | POA: Diagnosis not present

## 2021-08-16 NOTE — Progress Notes (Signed)
Virtual Visit via Telephone Note  I connected with Ryan Cannon on 08/16/21 at 11:30 AM EST by telephone and verified that I am speaking with the correct person using two identifiers.  Location: Patient: Home Provider: Clinic   I discussed the limitations, risks, security and privacy concerns of performing an evaluation and management service by telephone and the availability of in person appointments. I also discussed with the patient that there may be a patient responsible charge related to this service. The patient expressed understanding and agreed to proceed.   History of Present Illness: Ryan Cannon is a 56 year old male with multiple medical problems including multiple sclerosis and recently found to have probable stage IV lung cancer with metastasis to scapula and possible liver.  Patient has a lytic destructive lesion involving the inferior angle of the right scapula.  Work-up is ongoing.  Patient is pending PET scan on 08/13/2021.   Observations/Objective: I called and spoke with patient's mother and then patient.  Both report significant improvement in pain since starting on steroids/oxycodone.  He says that he is taking the oxycodone to 3 times a day.  He denies other symptomatic changes or concerns.  No issues with pain medications.  He says that his appetite is "picking up."  Assessment and Plan: Neoplasm related pain -continue dexamethasone/oxycodone.  Can consider weaning medications as tolerated.  Hopefully, pain will continue to improve on XRT.  Follow Up Instructions: Patient has scheduled follow-up with Dr. Rogue Bussing next week.  [facility] as needed   I discussed the assessment and treatment plan with the patient. The patient was provided an opportunity to ask questions and all were answered. The patient agreed with the plan and demonstrated an understanding of the instructions.   The patient was advised to call back or seek an in-person evaluation if the symptoms worsen or  if the condition fails to improve as anticipated.  I provided 5 minutes of non-face-to-face time during this encounter.   Irean Hong, NP

## 2021-08-16 NOTE — Telephone Encounter (Signed)
Per request of Josh, NP, Rn added phone number for patient [phone]) as patient's direct phone number.

## 2021-08-19 ENCOUNTER — Inpatient Hospital Stay: Payer: Medicaid Other

## 2021-08-19 ENCOUNTER — Encounter: Payer: Self-pay | Admitting: *Deleted

## 2021-08-19 ENCOUNTER — Ambulatory Visit: Admission: RE | Admit: 2021-08-19 | Payer: Medicaid Other | Source: Ambulatory Visit

## 2021-08-19 ENCOUNTER — Other Ambulatory Visit: Payer: Self-pay

## 2021-08-19 ENCOUNTER — Other Ambulatory Visit: Payer: Self-pay | Admitting: Internal Medicine

## 2021-08-19 ENCOUNTER — Inpatient Hospital Stay (HOSPITAL_BASED_OUTPATIENT_CLINIC_OR_DEPARTMENT_OTHER): Payer: Medicaid Other | Admitting: Internal Medicine

## 2021-08-19 VITALS — BP 132/98 | HR 90 | Temp 93.9°F | Ht 69.0 in | Wt 172.2 lb

## 2021-08-19 DIAGNOSIS — C3490 Malignant neoplasm of unspecified part of unspecified bronchus or lung: Secondary | ICD-10-CM

## 2021-08-19 DIAGNOSIS — C3412 Malignant neoplasm of upper lobe, left bronchus or lung: Secondary | ICD-10-CM | POA: Insufficient documentation

## 2021-08-19 DIAGNOSIS — Z510 Encounter for antineoplastic radiation therapy: Secondary | ICD-10-CM | POA: Diagnosis not present

## 2021-08-19 DIAGNOSIS — R918 Other nonspecific abnormal finding of lung field: Secondary | ICD-10-CM

## 2021-08-19 LAB — CBC WITH DIFFERENTIAL/PLATELET
Abs Immature Granulocytes: 0.07 10*3/uL (ref 0.00–0.07)
Basophils Absolute: 0.1 10*3/uL (ref 0.0–0.1)
Basophils Relative: 0 %
Eosinophils Absolute: 0.2 10*3/uL (ref 0.0–0.5)
Eosinophils Relative: 2 %
HCT: 40.6 % (ref 39.0–52.0)
Hemoglobin: 13.3 g/dL (ref 13.0–17.0)
Immature Granulocytes: 1 %
Lymphocytes Relative: 14 %
Lymphs Abs: 2.1 10*3/uL (ref 0.7–4.0)
MCH: 29.1 pg (ref 26.0–34.0)
MCHC: 32.8 g/dL (ref 30.0–36.0)
MCV: 88.8 fL (ref 80.0–100.0)
Monocytes Absolute: 1.4 10*3/uL — ABNORMAL HIGH (ref 0.1–1.0)
Monocytes Relative: 9 %
Neutro Abs: 11.4 10*3/uL — ABNORMAL HIGH (ref 1.7–7.7)
Neutrophils Relative %: 74 %
Platelets: 416 10*3/uL — ABNORMAL HIGH (ref 150–400)
RBC: 4.57 MIL/uL (ref 4.22–5.81)
RDW: 15.1 % (ref 11.5–15.5)
WBC: 15.3 10*3/uL — ABNORMAL HIGH (ref 4.0–10.5)
nRBC: 0 % (ref 0.0–0.2)

## 2021-08-19 LAB — COMPREHENSIVE METABOLIC PANEL
ALT: 13 U/L (ref 0–44)
AST: 15 U/L (ref 15–41)
Albumin: 3.5 g/dL (ref 3.5–5.0)
Alkaline Phosphatase: 57 U/L (ref 38–126)
Anion gap: 9 (ref 5–15)
BUN: 8 mg/dL (ref 6–20)
CO2: 30 mmol/L (ref 22–32)
Calcium: 9 mg/dL (ref 8.9–10.3)
Chloride: 94 mmol/L — ABNORMAL LOW (ref 98–111)
Creatinine, Ser: 0.8 mg/dL (ref 0.61–1.24)
GFR, Estimated: >60 mL/min (ref >60)
Glucose, Bld: 110 mg/dL — ABNORMAL HIGH (ref 70–99)
Potassium: 3.6 mmol/L (ref 3.5–5.1)
Sodium: 133 mmol/L — ABNORMAL LOW (ref 135–145)
Total Bilirubin: 0.3 mg/dL (ref 0.3–1.2)
Total Protein: 7.7 g/dL (ref 6.5–8.1)

## 2021-08-19 LAB — SURGICAL PATHOLOGY

## 2021-08-19 LAB — LACTATE DEHYDROGENASE: LDH: 279 U/L — ABNORMAL HIGH (ref 98–192)

## 2021-08-19 NOTE — Progress Notes (Signed)
START ON PATHWAY REGIMEN - Non-Small Cell Lung     Cycles 1 through up to 6: A cycle is every 21 days:     Bevacizumab-xxxx      Pemetrexed      Carboplatin   **Always confirm dose/schedule in your pharmacy ordering system**  Patient Characteristics: Stage IV Metastatic, Nonsquamous, Awaiting Molecular Test Results and Need to Start Chemotherapy, PS = 0, 1 Therapeutic Status: Stage IV Metastatic Histology: Nonsquamous Cell Broad Molecular Profiling Status: Awaiting Molecular Test Results and Need to Start Chemotherapy ECOG Performance Status: 1 Intent of Therapy: Non-Curative / Palliative Intent, Discussed with Patient

## 2021-08-19 NOTE — Progress Notes (Signed)
Patient on plan of care prior to pathways. 
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
## 2021-08-19 NOTE — Progress Notes (Signed)
Foundation one + PDL1 order submitted via online on 1/27. Order faxed to pathology to send out block/slides. Notes and demographics faxed today.

## 2021-08-19 NOTE — Progress Notes (Signed)
Met with patient during follow up visit with Dr. Rogue Bussing to review recent biopsy results and treatment options. All questions answered during visit. Pt stated that he was ready to go home and wanted follow up appts to be given to him when he comes in for radiation tomorrow. Informed pt that will meet with him after radiation to review appts. Pt verbalized understanding.

## 2021-08-19 NOTE — Assessment & Plan Note (Addendum)
#  Bilateral lung masses - left upper lobe [5.3 x 2.5 cm ] and right middle/upper lobe [4.5 x 4.0 cm]- highly suspicious for neoplasm, possibly metastases with indeterminate primary. Lytic destructive lesion involving the inferior angle of the right scapula, concerning for a metastasis.Indeterminate hypodense right hepatic lobe lesion measuring 1.1 cm.JAN 2023- PET scan-confirms above malignant findings.  S/p biopsy-positive for adenocarcinoma lung primary.  NGS/PD-L1 pending.  #I reviewed the pathology/stage of cancer-4 the patient in detail.  Discussed unfortunately treatments are palliative not curative.  Recommend systemic chemotherapy carboplatin Alimta Avastin.  We will hold off using immunotherapy given patient's history of multiple sclerosis [see below].  Would also recommend MRI brain for to rule out any metastatic disease.  # Discussed the potential side effects including but not limited to-increasing fatigue, nausea vomiting, diarrhea, hair loss, sores in the mouth, increase risk of infection and also neuropathy.  Reviewed the rationale for using Avastin. Discussed the potential side effects including but not limited to elevated blood pressure ; nephrotic syndrome wound healing problems.  #History of multiple sclerosis-on Avonex  SQ. discussed with Dr. Manuella Ghazi; neurology-states multiple sclerosis has been stable.  However I would still be cautious using immunotherapy given the concerns for exacerbation of multiple sclerosis with immunotherapy.  #Referral for port placement; chemotherapy education; Emla cream; antiemetics.  B12 injection; folate every day  # Pain right scapula-second toe metastatic malignancy - started on RT [Jan 30th-Feb, 13th]- poorly controlled on oxycodone 10-20  mg every 6 hours.;  Consider zometa.  Discussed with Josh border/palliative care.  # Smoking: Recommended patient quit smoking.   *pt cant drive # DISPOSITION: # MRI brain  # referral to IR re; port placmenet #  chemo education; B12 injection.  # follow up in 2 weeks- MD: labs- cbc/cmp;CarboAlitma-Avastin; UA;   # I reviewed the blood work- with the patient in detail; also reviewed the imaging independently [as summarized above]; and with the patient in detail.   # 40 minutes face-to-face with the patient discussing the above plan of care; more than 50% of time spent on prognosis/ natural history; counseling and coordination.  Addendum: Patient evaluated for hiccups- ?  Dexamethasone s/p evaluation with Josh Borders.  Currently on Thorazine.  Discussed with [employer].  ? Need for dex pre-med.

## 2021-08-20 ENCOUNTER — Ambulatory Visit
Admission: RE | Admit: 2021-08-20 | Discharge: 2021-08-20 | Disposition: A | Discharge status: Home / Self Care | Payer: Medicaid Other | Source: Ambulatory Visit | Attending: Radiation Oncology | Admitting: Radiation Oncology

## 2021-08-20 ENCOUNTER — Ambulatory Visit: Payer: Medicaid Other

## 2021-08-20 DIAGNOSIS — Z510 Encounter for antineoplastic radiation therapy: Secondary | ICD-10-CM | POA: Diagnosis not present

## 2021-08-21 ENCOUNTER — Ambulatory Visit: Admission: RE | Admit: 2021-08-21 | Payer: Medicaid Other | Source: Ambulatory Visit

## 2021-08-21 ENCOUNTER — Ambulatory Visit
Admission: RE | Admit: 2021-08-21 | Discharge: 2021-08-21 | Disposition: A | Discharge status: Home / Self Care | Payer: Medicaid Other | Source: Ambulatory Visit | Attending: Radiation Oncology | Admitting: Radiation Oncology

## 2021-08-21 ENCOUNTER — Telehealth: Payer: Self-pay | Admitting: *Deleted

## 2021-08-21 DIAGNOSIS — C7951 Secondary malignant neoplasm of bone: Secondary | ICD-10-CM | POA: Diagnosis present

## 2021-08-21 DIAGNOSIS — Z510 Encounter for antineoplastic radiation therapy: Secondary | ICD-10-CM | POA: Diagnosis not present

## 2021-08-21 DIAGNOSIS — C3490 Malignant neoplasm of unspecified part of unspecified bronchus or lung: Secondary | ICD-10-CM | POA: Diagnosis present

## 2021-08-21 MED ORDER — GABAPENTIN 100 MG PO CAPS: 100.0000 mg | ORAL_CAPSULE | Freq: Two times a day (BID) | ORAL | 0 refills | Status: DC | PRN

## 2021-08-21 NOTE — Telephone Encounter (Signed)
Pt called in to report has been having hiccups for the past 3 days without any relief. Per Merrily Pew, may send in prescription for gabapentin 100mg  BID prn. Prescription sent into pharmacy. Will update pt when comes in this afternoon for radiation treatment.

## 2021-08-22 ENCOUNTER — Ambulatory Visit: Payer: Medicaid Other

## 2021-08-22 ENCOUNTER — Encounter: Payer: Self-pay | Admitting: Internal Medicine

## 2021-08-22 ENCOUNTER — Other Ambulatory Visit: Payer: Medicaid Other

## 2021-08-22 NOTE — Progress Notes (Signed)
Tumor Board Documentation  SANTOS Ryan Cannon was presented by Dr Rogue Bussing at our Tumor Board on 08/22/2021, which included representatives from medical oncology, radiation oncology, radiology, pathology, surgical, pharmacy, genetics, pulmonology, research, palliative care, navigation, internal medicine.  Ryan Cannon currently presents as a new patient, for [name], for new positive pathology with history of the following treatments:  .  Additionally, we reviewed previous medical and familial history, history of present illness, and recent lab results along with all available histopathologic and imaging studies. The tumor board considered available treatment options and made the following recommendations: Palliative radiation therapy, Chemotherapy, Additional screening (MRI Brain, NGS Testing pending)    The following procedures/referrals were also placed: No orders of the defined types were placed in this encounter.   Clinical Trial Status: not discussed   Staging used: AJCC Stage Group AJCC Staging: T: 4 N: 0 M: 1 Group: Stage IV [place] of Lung with Bone Mets   National site-specific guidelines NCCN were discussed with respect to the case.  Tumor board is a meeting of clinicians from various specialty areas who evaluate and discuss patients for whom a multidisciplinary approach is being considered. Final determinations in the plan of care are those of the provider(s). The responsibility for follow up of recommendations given during tumor board is that of the provider.   Todays extended care, comprehensive team conference, [place] was not present for the discussion and was not examined.   Multidisciplinary Tumor Board is a multidisciplinary case peer review process.  Decisions discussed in the Multidisciplinary Tumor Board reflect the opinions of the specialists present at the conference without having examined the patient.  Ultimately, treatment and diagnostic decisions rest with the primary  provider(s) and the patient.

## 2021-08-23 ENCOUNTER — Ambulatory Visit: Payer: Medicaid Other

## 2021-08-23 ENCOUNTER — Other Ambulatory Visit: Payer: Self-pay | Admitting: *Deleted

## 2021-08-23 MED ORDER — FENTANYL 25 MCG/HR TD PT72: 1.0000 | MEDICATED_PATCH | TRANSDERMAL | 0 refills | Status: DC

## 2021-08-23 MED ORDER — OXYCODONE HCL 10 MG PO TABS
10.0000 mg | ORAL_TABLET | ORAL | 0 refills | Status: DC | PRN
Start: 2021-08-23 — End: 2021-09-01

## 2021-08-23 NOTE — Progress Notes (Signed)
Patient on schedule for Port placement 08/28/2021, called and spoke with patient with pre procedure instructions given. Made aware to be here @ 1230, NPO after 0630 day of procedure and driver post procedure/recovery/discharge. Stated understanding.

## 2021-08-23 NOTE — Telephone Encounter (Addendum)
Spoke with patient who stated that he needs another refill of his oxycodone. States provides some relief for his pain but not very well controlled. Per Merrily Pew, may send in oxycodone refill and add fentanyl 73mcg patch to help with pain management. Pt has been made aware of new prescription and how to apply fentanyl patch.

## 2021-08-24 ENCOUNTER — Ambulatory Visit: Payer: Medicaid Other

## 2021-08-25 ENCOUNTER — Ambulatory Visit: Payer: Medicaid Other

## 2021-08-26 ENCOUNTER — Ambulatory Visit: Admission: RE | Admit: 2021-08-26 | Payer: Medicaid Other | Source: Ambulatory Visit

## 2021-08-26 ENCOUNTER — Ambulatory Visit: Payer: Medicaid Other

## 2021-08-26 ENCOUNTER — Inpatient Hospital Stay: Payer: Medicaid Other

## 2021-08-26 ENCOUNTER — Encounter: Payer: Self-pay | Admitting: *Deleted

## 2021-08-26 ENCOUNTER — Telehealth: Payer: Self-pay | Admitting: *Deleted

## 2021-08-26 MED ORDER — GABAPENTIN 300 MG PO CAPS: 300.0000 mg | ORAL_CAPSULE | Freq: Two times a day (BID) | ORAL | 0 refills | Status: DC | PRN

## 2021-08-26 NOTE — Progress Notes (Signed)
Pharmacist Chemotherapy Monitoring - Initial Assessment    Anticipated start date: 09/02/21   The following has been reviewed per standard work regarding the patient's treatment regimen: The patient's diagnosis, treatment plan and drug doses, and organ/hematologic function Lab orders and baseline tests specific to treatment regimen  The treatment plan start date, drug sequencing, and pre-medications Prior authorization status  Patient's documented medication list, including drug-drug interaction screen and prescriptions for anti-emetics and supportive care specific to the treatment regimen The drug concentrations, fluid compatibility, administration routes, and timing of the medications to be used The patient's access for treatment and lifetime cumulative dose history, if applicable  The patient's medication allergies and previous infusion related reactions, if applicable   Changes made to treatment plan:  treatment plan date  Follow up needed:  signing treatment plan   Ryan Cannon, [place], 08/26/2021  8:39 AM

## 2021-08-26 NOTE — Telephone Encounter (Signed)
This request has received a Favorable outcome.

## 2021-08-26 NOTE — Progress Notes (Signed)
Spoke with pt's mother who stated that pt is having increased pain and unable to lay flat. Pt would like to cancel his radiation treatment and brain MRI today. Informed that pt has radiation scheduled tomorrow at 230pm and will get brain MRI rescheduled to later in the week. Pt's mother verbalized understanding.

## 2021-08-26 NOTE — Progress Notes (Signed)
Spoke with patient to help reschedule a few appts. Pt is having issues with transportation. Informed pt that will reschedule his appt for chemo education class and add on to transportation schedule since he is unable to find a ride for morning appts. Pt stated that his hiccups are getting worse. Per Josh, may increase gabapentin to 300mg  BID. New prescription sent into pharmacy. Pt made aware. Nothing further needed at this time.

## 2021-08-26 NOTE — Telephone Encounter (Signed)
[place]: [id number] - PA Case ID: [id number] PA submitted for Fentanyl patches via covermymeds Pending insurance approval

## 2021-08-27 ENCOUNTER — Encounter: Payer: Self-pay | Admitting: Internal Medicine

## 2021-08-27 ENCOUNTER — Ambulatory Visit: Payer: Medicaid Other

## 2021-08-27 NOTE — Telephone Encounter (Signed)
On 2/6-RN Faxed pharmacy approval

## 2021-08-28 ENCOUNTER — Ambulatory Visit
Admission: RE | Admit: 2021-08-28 | Discharge: 2021-08-28 | Disposition: A | Discharge status: Home / Self Care | Payer: Medicaid Other | Source: Ambulatory Visit | Attending: Radiation Oncology | Admitting: Radiation Oncology

## 2021-08-28 ENCOUNTER — Encounter: Payer: Self-pay | Admitting: *Deleted

## 2021-08-28 ENCOUNTER — Ambulatory Visit
Admission: RE | Admit: 2021-08-28 | Discharge: 2021-08-28 | Disposition: A | Discharge status: Home / Self Care | Payer: Medicaid Other | Source: Ambulatory Visit | Attending: Internal Medicine | Admitting: Internal Medicine

## 2021-08-28 ENCOUNTER — Telehealth: Payer: Self-pay | Admitting: *Deleted

## 2021-08-28 ENCOUNTER — Inpatient Hospital Stay: Payer: Medicaid Other | Attending: Internal Medicine | Admitting: Hospice and Palliative Medicine

## 2021-08-28 ENCOUNTER — Other Ambulatory Visit: Payer: Self-pay

## 2021-08-28 VITALS — BP 140/89 | HR 114 | Temp 96.1°F | Resp 18 | Wt 166.5 lb

## 2021-08-28 DIAGNOSIS — Z87891 Personal history of nicotine dependence: Secondary | ICD-10-CM | POA: Insufficient documentation

## 2021-08-28 DIAGNOSIS — C7951 Secondary malignant neoplasm of bone: Secondary | ICD-10-CM | POA: Insufficient documentation

## 2021-08-28 DIAGNOSIS — G893 Neoplasm related pain (acute) (chronic): Secondary | ICD-10-CM | POA: Insufficient documentation

## 2021-08-28 DIAGNOSIS — R918 Other nonspecific abnormal finding of lung field: Secondary | ICD-10-CM | POA: Insufficient documentation

## 2021-08-28 DIAGNOSIS — R066 Hiccough: Secondary | ICD-10-CM | POA: Diagnosis not present

## 2021-08-28 DIAGNOSIS — G35 Multiple sclerosis: Secondary | ICD-10-CM | POA: Insufficient documentation

## 2021-08-28 DIAGNOSIS — Z510 Encounter for antineoplastic radiation therapy: Secondary | ICD-10-CM | POA: Diagnosis not present

## 2021-08-28 DIAGNOSIS — Z5112 Encounter for antineoplastic immunotherapy: Secondary | ICD-10-CM | POA: Insufficient documentation

## 2021-08-28 DIAGNOSIS — C3412 Malignant neoplasm of upper lobe, left bronchus or lung: Secondary | ICD-10-CM | POA: Diagnosis not present

## 2021-08-28 DIAGNOSIS — Z5111 Encounter for antineoplastic chemotherapy: Secondary | ICD-10-CM | POA: Insufficient documentation

## 2021-08-28 DIAGNOSIS — C787 Secondary malignant neoplasm of liver and intrahepatic bile duct: Secondary | ICD-10-CM | POA: Insufficient documentation

## 2021-08-28 DIAGNOSIS — C3481 Malignant neoplasm of overlapping sites of right bronchus and lung: Secondary | ICD-10-CM | POA: Insufficient documentation

## 2021-08-28 MED ORDER — PANTOPRAZOLE SODIUM 40 MG PO TBEC: 40.0000 mg | DELAYED_RELEASE_TABLET | Freq: Every day | ORAL | 0 refills | Status: DC

## 2021-08-28 MED ORDER — CHLORPROMAZINE HCL 25 MG PO TABS: 25.0000 mg | ORAL_TABLET | Freq: Three times a day (TID) | ORAL | 0 refills | Status: DC | PRN

## 2021-08-28 NOTE — Telephone Encounter (Signed)
[employer] is reviewing your PA request. Typically an electronic response will be received within 24-72 hours. To check for an update later, open this request from your dashboard.  You may close this dialog and return to your dashboard to perform other tasks.

## 2021-08-28 NOTE — Progress Notes (Signed)
Pt reports that he has has continuous hiccups for the last 5 days. He has been unable to sleep because of this. Reports that his appetite is also down. He will occasionally have a moment where he cant catch his breath.

## 2021-08-28 NOTE — Telephone Encounter (Signed)
[place]e [place]: [facility] - Rx #: [id number]  PA submitted for chlorproMAZINE HCl 25MG  tablets via covermymeds-  [employer] Electronic Prior Authorization Form (2017 NCPDP)  Pending insurance approval.
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
## 2021-08-29 ENCOUNTER — Ambulatory Visit
Admission: RE | Admit: 2021-08-29 | Discharge: 2021-08-29 | Disposition: A | Discharge status: Home / Self Care | Payer: Medicaid Other | Source: Ambulatory Visit | Attending: Radiation Oncology | Admitting: Radiation Oncology

## 2021-08-29 ENCOUNTER — Ambulatory Visit: Payer: Medicaid Other

## 2021-08-29 ENCOUNTER — Inpatient Hospital Stay: Payer: Medicaid Other

## 2021-08-29 ENCOUNTER — Encounter: Payer: Self-pay | Admitting: Internal Medicine

## 2021-08-29 ENCOUNTER — Ambulatory Visit
Admission: RE | Admit: 2021-08-29 | Discharge: 2021-08-29 | Disposition: A | Discharge status: Home / Self Care | Payer: Medicaid Other | Attending: Hospice and Palliative Medicine | Admitting: Hospice and Palliative Medicine

## 2021-08-29 ENCOUNTER — Ambulatory Visit
Admission: RE | Admit: 2021-08-29 | Discharge: 2021-08-29 | Disposition: A | Discharge status: Home / Self Care | Payer: Medicaid Other | Source: Ambulatory Visit | Attending: Hospice and Palliative Medicine | Admitting: Hospice and Palliative Medicine

## 2021-08-29 DIAGNOSIS — Z510 Encounter for antineoplastic radiation therapy: Secondary | ICD-10-CM | POA: Diagnosis not present

## 2021-08-29 DIAGNOSIS — C3412 Malignant neoplasm of upper lobe, left bronchus or lung: Secondary | ICD-10-CM

## 2021-08-29 MED ORDER — CYANOCOBALAMIN 1000 MCG/ML IJ SOLN
1000.0000 ug | Freq: Once | INTRAMUSCULAR | Status: AC
  Administered 2021-08-29: 1000 ug via INTRAMUSCULAR
  Filled 2021-08-29: qty 1

## 2021-08-29 MED ORDER — ONDANSETRON HCL 8 MG PO TABS: ORAL_TABLET | ORAL | 1 refills | Status: DC

## 2021-08-29 MED ORDER — PROCHLORPERAZINE MALEATE 10 MG PO TABS: 10.0000 mg | ORAL_TABLET | Freq: Four times a day (QID) | ORAL | 1 refills | Status: DC | PRN

## 2021-08-29 MED ORDER — FOLIC ACID 1 MG PO TABS: 1.0000 mg | ORAL_TABLET | Freq: Every day | ORAL | 1 refills | Status: DC

## 2021-08-29 MED ORDER — LIDOCAINE-PRILOCAINE 2.5-2.5 % EX CREA: TOPICAL_CREAM | CUTANEOUS | 3 refills | Status: DC

## 2021-08-29 NOTE — Telephone Encounter (Signed)
Rcvd request from [facility] to fax note faxed along with additional questions to process this claim. Information faxed to [facility] at 10 am on 08/29/21

## 2021-08-30 ENCOUNTER — Ambulatory Visit: Payer: Medicaid Other

## 2021-08-30 ENCOUNTER — Encounter: Payer: Self-pay | Admitting: Internal Medicine

## 2021-08-30 ENCOUNTER — Telehealth: Payer: Self-pay | Admitting: *Deleted

## 2021-08-30 ENCOUNTER — Telehealth: Payer: Self-pay

## 2021-08-30 ENCOUNTER — Other Ambulatory Visit: Payer: Self-pay | Admitting: *Deleted

## 2021-08-30 MED ORDER — FENTANYL 50 MCG/HR TD PT72
1.0000 | MEDICATED_PATCH | TRANSDERMAL | 0 refills | Status: DC
Start: 2021-08-30 — End: 2021-09-01

## 2021-08-30 NOTE — Telephone Encounter (Signed)
PA submitted for fentanyl patches- 50 mcg 1 patch every 72 hours. Per Hayley, pt's insurance will most likely need a PA for the increase patch dosing.  [place]: [id number] - PA Case ID: [id number]  Pending insurance review.

## 2021-08-30 NOTE — Telephone Encounter (Signed)
Msg left for [facility] pharmacy that thorazine script was approved by insurance. I made Josh, NP aware that script was approved.

## 2021-08-30 NOTE — Telephone Encounter (Signed)
Pain not well controlled with current pain medications. Per Josh, will increase fentanyl patch to 94mcg. Pt instructed to wear 2 - 63mcg patches at this time and can pick up new prescription sent into pharmacy.

## 2021-08-30 NOTE — Telephone Encounter (Signed)
[employer] is reviewing your PA request. Typically an electronic response will be received within 24-72 hours. To check for an update later, open this request from your dashboard.  You may close this dialog and return to your dashboard to perform other tasks.

## 2021-08-30 NOTE — Telephone Encounter (Signed)
-----   Message from [place], [place] sent at 08/30/2021  8:46 AM EST ----- Regarding: RE: pt follow up [facility], will do! Pt has refused the port at this time. He wants to try chemo without it. I told him that if he wants it at anytime to let us know so we can get him scheduled.   Angie Fava ----- Message ----- From: Cammie Sickle, MD Sent: 08/29/2021   7:22 PM EST To: Telford Nab, RN Subject: pt follow up                                   Hayley-I have sent prescription for antiemetics; folic acid Emla cream.  Please make sure patient picks those prescriptions up/prior to starting chemotherapy.

## 2021-08-30 NOTE — Progress Notes (Signed)
Spoke with patient by phone in the morning who stated that he needs to see someone to help him with his hiccups that have been persistent for the past week. Current medications not helping provide relief. Informed pt that will add on the Ryan Cannon Cannon's schedule in the [facility] after his radiation treatment today.   Pt seen in [facility]. Spoke with pt's niece, Ryan Cannon Cannon, and reviewed all upcoming appts and treatment plan with her. She stated that he is going to need help with transportation next week for appts. Informed that will get him scheduled for transportation. All questions answered during visit. Advised pt to pick up new prescription today and return for chest xray after his radiation treatment on Thursday. Pt verbalized understanding. Instructed to call with any questions or needs.

## 2021-08-30 NOTE — Telephone Encounter (Signed)
Noted  

## 2021-08-31 ENCOUNTER — Ambulatory Visit: Admission: RE | Admit: 2021-08-31 | Payer: Medicaid Other | Source: Ambulatory Visit

## 2021-09-01 ENCOUNTER — Other Ambulatory Visit: Payer: Self-pay | Admitting: Oncology

## 2021-09-01 MED ORDER — OXYCODONE HCL 10 MG PO TABS: 10.0000 mg | ORAL_TABLET | ORAL | 0 refills | Status: DC | PRN

## 2021-09-01 MED ORDER — FENTANYL 50 MCG/HR TD PT72: 1.0000 | MEDICATED_PATCH | TRANSDERMAL | 0 refills | Status: DC

## 2021-09-02 ENCOUNTER — Inpatient Hospital Stay: Payer: Medicaid Other | Admitting: Internal Medicine

## 2021-09-02 ENCOUNTER — Inpatient Hospital Stay: Payer: Medicaid Other

## 2021-09-02 ENCOUNTER — Encounter: Payer: Self-pay | Admitting: Internal Medicine

## 2021-09-02 ENCOUNTER — Ambulatory Visit: Payer: Medicaid Other

## 2021-09-02 ENCOUNTER — Encounter: Payer: Self-pay | Admitting: *Deleted

## 2021-09-02 ENCOUNTER — Other Ambulatory Visit: Payer: Self-pay | Admitting: *Deleted

## 2021-09-02 DIAGNOSIS — C3412 Malignant neoplasm of upper lobe, left bronchus or lung: Secondary | ICD-10-CM

## 2021-09-02 MED ORDER — FENTANYL 50 MCG/HR TD PT72: 1.0000 | MEDICATED_PATCH | TRANSDERMAL | 0 refills | Status: DC

## 2021-09-02 NOTE — Progress Notes (Signed)
Received call from pt this morning stating that he ran out of pain medicine this weekend. Pt stated that he did not feel they were working very well so took more than prescribed. Informed pt that will call pharmacy to check on the status of his refills. Per [facility], oxycodone will be filled today and fentanyl patches are on back-order at this time. Called [facility] pharmacy who has 5 patches available. New prescription sent into [facility] pharmacy for fentanyl patches 37mcg. Pt made aware to pick up prescriptions from [facility] and [facility] pharmacy. Advised that he may take 2 tablets of oxycodone every 4 hours as needed for pain. Thoroughly reviewed how to use fentanyl patches and that he needs to wear them for 3 days before removing or replacing with another patch. Educated that the patches are more effective if used as prescribed. Encouraged to take oxycodone during the day for breakthrough pain. Reviewed with patient his upcoming appts this week and informed that will let him know when his chemo appt has been rescheduled. Reassurance provided and informed to focus on managing his pain today and to call if needs any further assistance.   Email sent to pt's niece, Karen Kays, to provide update.   Referral to pain clinic placed per Josh to evaluate for possible nerve block.

## 2021-09-02 NOTE — Telephone Encounter (Signed)
Pa for increase in dose for fentanyl patch- not required per insurance.

## 2021-09-02 NOTE — Telephone Encounter (Signed)
error 

## 2021-09-03 ENCOUNTER — Ambulatory Visit
Admission: RE | Admit: 2021-09-03 | Discharge: 2021-09-03 | Disposition: A | Discharge status: Home / Self Care | Payer: Medicaid Other | Source: Ambulatory Visit | Attending: Radiation Oncology | Admitting: Radiation Oncology

## 2021-09-03 ENCOUNTER — Inpatient Hospital Stay: Payer: Medicaid Other

## 2021-09-03 ENCOUNTER — Other Ambulatory Visit: Payer: Self-pay

## 2021-09-03 ENCOUNTER — Ambulatory Visit: Payer: Medicaid Other

## 2021-09-03 DIAGNOSIS — Z510 Encounter for antineoplastic radiation therapy: Secondary | ICD-10-CM | POA: Diagnosis not present

## 2021-09-04 ENCOUNTER — Ambulatory Visit: Payer: Medicaid Other

## 2021-09-04 ENCOUNTER — Inpatient Hospital Stay: Payer: Medicaid Other

## 2021-09-05 ENCOUNTER — Ambulatory Visit: Payer: Medicaid Other

## 2021-09-05 ENCOUNTER — Inpatient Hospital Stay: Payer: Medicaid Other

## 2021-09-05 ENCOUNTER — Inpatient Hospital Stay: Payer: Medicaid Other | Admitting: Internal Medicine

## 2021-09-05 ENCOUNTER — Inpatient Hospital Stay: Payer: Medicaid Other | Admitting: Hospice and Palliative Medicine

## 2021-09-06 ENCOUNTER — Inpatient Hospital Stay: Payer: Medicaid Other

## 2021-09-06 ENCOUNTER — Ambulatory Visit: Payer: Medicaid Other

## 2021-09-06 ENCOUNTER — Inpatient Hospital Stay (HOSPITAL_BASED_OUTPATIENT_CLINIC_OR_DEPARTMENT_OTHER): Payer: Medicaid Other | Admitting: Hospice and Palliative Medicine

## 2021-09-06 ENCOUNTER — Encounter: Payer: Self-pay | Admitting: Internal Medicine

## 2021-09-06 ENCOUNTER — Encounter: Payer: Self-pay | Admitting: *Deleted

## 2021-09-06 ENCOUNTER — Other Ambulatory Visit: Payer: Self-pay

## 2021-09-06 ENCOUNTER — Ambulatory Visit: Admission: RE | Admit: 2021-09-06 | Payer: Medicaid Other | Source: Ambulatory Visit

## 2021-09-06 ENCOUNTER — Inpatient Hospital Stay (HOSPITAL_BASED_OUTPATIENT_CLINIC_OR_DEPARTMENT_OTHER): Payer: Medicaid Other | Admitting: Internal Medicine

## 2021-09-06 DIAGNOSIS — C3412 Malignant neoplasm of upper lobe, left bronchus or lung: Secondary | ICD-10-CM

## 2021-09-06 DIAGNOSIS — Z87891 Personal history of nicotine dependence: Secondary | ICD-10-CM | POA: Insufficient documentation

## 2021-09-06 DIAGNOSIS — C7951 Secondary malignant neoplasm of bone: Secondary | ICD-10-CM | POA: Diagnosis not present

## 2021-09-06 DIAGNOSIS — Z5112 Encounter for antineoplastic immunotherapy: Secondary | ICD-10-CM | POA: Diagnosis present

## 2021-09-06 DIAGNOSIS — R918 Other nonspecific abnormal finding of lung field: Secondary | ICD-10-CM | POA: Diagnosis not present

## 2021-09-06 DIAGNOSIS — G35 Multiple sclerosis: Secondary | ICD-10-CM | POA: Insufficient documentation

## 2021-09-06 DIAGNOSIS — G893 Neoplasm related pain (acute) (chronic): Secondary | ICD-10-CM | POA: Insufficient documentation

## 2021-09-06 DIAGNOSIS — Z5111 Encounter for antineoplastic chemotherapy: Secondary | ICD-10-CM | POA: Diagnosis present

## 2021-09-06 DIAGNOSIS — C3490 Malignant neoplasm of unspecified part of unspecified bronchus or lung: Secondary | ICD-10-CM

## 2021-09-06 DIAGNOSIS — C787 Secondary malignant neoplasm of liver and intrahepatic bile duct: Secondary | ICD-10-CM | POA: Diagnosis not present

## 2021-09-06 DIAGNOSIS — R066 Hiccough: Secondary | ICD-10-CM | POA: Insufficient documentation

## 2021-09-06 DIAGNOSIS — Z515 Encounter for palliative care: Secondary | ICD-10-CM | POA: Diagnosis not present

## 2021-09-06 DIAGNOSIS — C3481 Malignant neoplasm of overlapping sites of right bronchus and lung: Secondary | ICD-10-CM | POA: Insufficient documentation

## 2021-09-06 LAB — COMPREHENSIVE METABOLIC PANEL
ALT: 9 U/L (ref 0–44)
AST: 15 U/L (ref 15–41)
Albumin: 3.7 g/dL (ref 3.5–5.0)
Alkaline Phosphatase: 57 U/L (ref 38–126)
Anion gap: 8 (ref 5–15)
BUN: 10 mg/dL (ref 6–20)
CO2: 29 mmol/L (ref 22–32)
Calcium: 9.3 mg/dL (ref 8.9–10.3)
Chloride: 98 mmol/L (ref 98–111)
Creatinine, Ser: 0.87 mg/dL (ref 0.61–1.24)
GFR, Estimated: >60 mL/min (ref >60)
Glucose, Bld: 93 mg/dL (ref 70–99)
Potassium: 4 mmol/L (ref 3.5–5.1)
Sodium: 135 mmol/L (ref 135–145)
Total Bilirubin: <0.1 mg/dL — ABNORMAL LOW (ref 0.3–1.2)
Total Protein: 8 g/dL (ref 6.5–8.1)

## 2021-09-06 LAB — CBC WITH DIFFERENTIAL/PLATELET
Abs Immature Granulocytes: 0.07 10*3/uL (ref 0.00–0.07)
Basophils Absolute: 0.1 10*3/uL (ref 0.0–0.1)
Basophils Relative: 0 %
Eosinophils Absolute: 0.2 10*3/uL (ref 0.0–0.5)
Eosinophils Relative: 1 %
HCT: 42.1 % (ref 39.0–52.0)
Hemoglobin: 13.3 g/dL (ref 13.0–17.0)
Immature Granulocytes: 1 %
Lymphocytes Relative: 9 %
Lymphs Abs: 1.2 10*3/uL (ref 0.7–4.0)
MCH: 28.6 pg (ref 26.0–34.0)
MCHC: 31.6 g/dL (ref 30.0–36.0)
MCV: 90.5 fL (ref 80.0–100.0)
Monocytes Absolute: 0.8 10*3/uL (ref 0.1–1.0)
Monocytes Relative: 6 %
Neutro Abs: 11 10*3/uL — ABNORMAL HIGH (ref 1.7–7.7)
Neutrophils Relative %: 83 %
Platelets: 391 10*3/uL (ref 150–400)
RBC: 4.65 MIL/uL (ref 4.22–5.81)
RDW: 14.3 % (ref 11.5–15.5)
WBC: 13.3 10*3/uL — ABNORMAL HIGH (ref 4.0–10.5)
nRBC: 0 % (ref 0.0–0.2)

## 2021-09-06 LAB — URINALYSIS, DIPSTICK ONLY
Bilirubin Urine: NEGATIVE
Glucose, UA: NEGATIVE mg/dL
Hgb urine dipstick: NEGATIVE
Ketones, ur: NEGATIVE mg/dL
Leukocytes,Ua: NEGATIVE
Nitrite: NEGATIVE
Protein, ur: NEGATIVE mg/dL
Specific Gravity, Urine: 1.018 (ref 1.005–1.030)
pH: 5 (ref 5.0–8.0)

## 2021-09-06 MED ORDER — SODIUM CHLORIDE 0.9 % IV SOLN
500.0000 mg/m2 | Freq: Once | INTRAVENOUS | Status: AC
  Administered 2021-09-06: 1000 mg via INTRAVENOUS
  Filled 2021-09-06: qty 40

## 2021-09-06 MED ORDER — SODIUM CHLORIDE 0.9 % IV SOLN
150.0000 mg | Freq: Once | INTRAVENOUS | Status: AC
  Administered 2021-09-06: 150 mg via INTRAVENOUS
  Filled 2021-09-06: qty 150

## 2021-09-06 MED ORDER — DEXAMETHASONE SODIUM PHOSPHATE 10 MG/ML IJ SOLN
4.0000 mg | Freq: Once | INTRAMUSCULAR | Status: AC
  Administered 2021-09-06: 4 mg via INTRAVENOUS
  Filled 2021-09-06: qty 1

## 2021-09-06 MED ORDER — SODIUM CHLORIDE 0.9 % IV SOLN
620.0000 mg | Freq: Once | INTRAVENOUS | Status: AC
  Administered 2021-09-06: 620 mg via INTRAVENOUS
  Filled 2021-09-06: qty 62

## 2021-09-06 MED ORDER — SODIUM CHLORIDE 0.9 % IV SOLN
Freq: Once | INTRAVENOUS | Status: AC
  Filled 2021-09-06: qty 250

## 2021-09-06 MED ORDER — ACETAMINOPHEN 325 MG PO TABS
650.0000 mg | ORAL_TABLET | Freq: Once | ORAL | Status: AC
  Administered 2021-09-06: 650 mg via ORAL
  Filled 2021-09-06: qty 2

## 2021-09-06 MED ORDER — SODIUM CHLORIDE 0.9 % IV SOLN: 642.5000 mg | Freq: Once | INTRAVENOUS | Status: DC

## 2021-09-06 MED ORDER — SODIUM CHLORIDE 0.9 % IV SOLN
15.0000 mg/kg | Freq: Once | INTRAVENOUS | Status: AC
  Administered 2021-09-06: 1200 mg via INTRAVENOUS
  Filled 2021-09-06: qty 48

## 2021-09-06 MED ORDER — PALONOSETRON HCL INJECTION 0.25 MG/5ML
0.2500 mg | Freq: Once | INTRAVENOUS | Status: AC
  Administered 2021-09-06: 0.25 mg via INTRAVENOUS
  Filled 2021-09-06: qty 5

## 2021-09-06 MED ORDER — OXYCODONE HCL 5 MG PO TABS
10.0000 mg | ORAL_TABLET | Freq: Once | ORAL | Status: AC
  Administered 2021-09-06: 10 mg via ORAL
  Filled 2021-09-06: qty 2

## 2021-09-06 MED ORDER — SODIUM CHLORIDE 0.9 % IV SOLN
4.0000 mg | Freq: Once | INTRAVENOUS | Status: DC
  Filled 2021-09-06: qty 0.4
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
## 2021-09-06 NOTE — Assessment & Plan Note (Addendum)
#  Stage IV lung cancer -adenocarcinoma lung primary.-bilateral lung masses - left upper lobe [5.3 x 2.5 cm ] and right middle/upper lobe [4.5 x 4.0 cm]- Lytic destructive lesion involving the inferior angle of the right scapula, concerning for a metastasis.Indeterminate hypodense right hepatic lobe lesion measuring 1.1 cm.JAN 2023- PET scan-confirms above malignant findings.  S/p biopsy  NGS/PD-L1 pending. Await to reschedule MRI brain. [Given Hx of MS-? CI to immunotherapy].  # Proceed with chemo- carboplatin Alimta Avastin- cycle #1. Labs today reviewed;  acceptable for treatment today.  Again reminded the patient to be vigilant about nausea vomiting; get his antiemetics.  Patient s/p B12 injection.  # multiple sclerosis-on Avonex  SQ. discussed with Dr. Manuella Ghazi; neurology-states multiple sclerosis- STABLE or worse- see below.    # Pain right scapula-second toe metastatic malignancy - started on RT [Jan 30th-Feb, 13th]- poorly controlled on oxycodone 10-20  mg every 6 hours.; on fenatnyl Ptach 89mg;  Consider zometa [needs dental clearance].  Discussed with Josh border/palliative care; currently on RT [until next week]  # Smoking: Recommended patient quit smoking.   # hiccups- ?  Dexamethasone s/p evaluation with Josh Borders.  Currently on Thorazine [Josh]; decrease dexamethasone to 4 mg premed IV.   # IV access: declines port [on bev]  *pt cant drive/ uber  # DISPOSITION: # chemo today  #in 3 weeks- MD: labs- cbc/cmp;CarboAlitma-Avastin; [facility]- Dr.B
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
## 2021-09-06 NOTE — Progress Notes (Signed)
Met with patient during follow up visit with Dr. Rogue Bussing to start chemotherapy treatments today. All questions answered during visit. Pt unable to complete radiation today due to increased pain in right scapula. Pt stated that he did not take enough pain medication this morning. Reviewed upcoming appts in detail with patient and instructed to take pain meds prior to radiation treatments. No further questions or needs at this time. Instructed to call with any questions or needs. Pt verbalized understanding.
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
## 2021-09-06 NOTE — Progress Notes (Signed)
Pt having rt arm pain.

## 2021-09-09 ENCOUNTER — Ambulatory Visit: Payer: Medicaid Other

## 2021-09-10 ENCOUNTER — Ambulatory Visit: Payer: Medicaid Other

## 2021-09-11 ENCOUNTER — Ambulatory Visit: Payer: Medicaid Other

## 2021-09-12 ENCOUNTER — Other Ambulatory Visit: Payer: Self-pay | Admitting: Oncology

## 2021-09-12 ENCOUNTER — Ambulatory Visit: Payer: Medicaid Other

## 2021-09-13 ENCOUNTER — Ambulatory Visit: Payer: Medicaid Other

## 2021-09-16 ENCOUNTER — Ambulatory Visit: Payer: Medicaid Other

## 2021-09-16 ENCOUNTER — Other Ambulatory Visit: Payer: Self-pay | Admitting: *Deleted

## 2021-09-16 MED ORDER — FENTANYL 50 MCG/HR TD PT72
1.0000 | MEDICATED_PATCH | TRANSDERMAL | 0 refills | Status: DC
Start: 2021-09-16 — End: 2021-10-14

## 2021-09-16 MED ORDER — OXYCODONE HCL 10 MG PO TABS: 10.0000 mg | ORAL_TABLET | ORAL | 0 refills | Status: DC | PRN

## 2021-09-16 NOTE — Telephone Encounter (Signed)
Pt requesting refill of fentanyl patches and oxycodone be sent to [facility]. States that current dose is helping manage his pain effectively.

## 2021-09-17 ENCOUNTER — Ambulatory Visit
Admission: RE | Admit: 2021-09-17 | Discharge: 2021-09-17 | Disposition: A | Discharge status: Home / Self Care | Payer: Medicaid Other | Source: Ambulatory Visit | Attending: Internal Medicine | Admitting: Internal Medicine

## 2021-09-17 ENCOUNTER — Ambulatory Visit: Payer: Medicaid Other

## 2021-09-17 ENCOUNTER — Other Ambulatory Visit: Payer: Self-pay

## 2021-09-17 ENCOUNTER — Ambulatory Visit
Admission: RE | Admit: 2021-09-17 | Discharge: 2021-09-17 | Disposition: A | Discharge status: Home / Self Care | Payer: Medicaid Other | Source: Ambulatory Visit | Attending: Radiation Oncology | Admitting: Radiation Oncology

## 2021-09-17 DIAGNOSIS — Z510 Encounter for antineoplastic radiation therapy: Secondary | ICD-10-CM | POA: Diagnosis present

## 2021-09-17 DIAGNOSIS — C3412 Malignant neoplasm of upper lobe, left bronchus or lung: Secondary | ICD-10-CM | POA: Diagnosis present

## 2021-09-17 DIAGNOSIS — C3490 Malignant neoplasm of unspecified part of unspecified bronchus or lung: Secondary | ICD-10-CM | POA: Diagnosis present

## 2021-09-17 DIAGNOSIS — C7951 Secondary malignant neoplasm of bone: Secondary | ICD-10-CM | POA: Diagnosis present

## 2021-09-17 MED ORDER — GADOBUTROL 1 MMOL/ML IV SOLN
7.5000 mL | Freq: Once | INTRAVENOUS | Status: AC | PRN
  Administered 2021-09-17: 7.5 mL via INTRAVENOUS

## 2021-09-18 ENCOUNTER — Encounter: Payer: Self-pay | Admitting: *Deleted

## 2021-09-18 ENCOUNTER — Encounter: Payer: Self-pay | Admitting: Internal Medicine

## 2021-09-18 ENCOUNTER — Ambulatory Visit: Payer: Medicaid Other

## 2021-09-18 ENCOUNTER — Ambulatory Visit
Admission: RE | Admit: 2021-09-18 | Discharge: 2021-09-18 | Disposition: A | Discharge status: Home / Self Care | Payer: Medicaid Other | Source: Ambulatory Visit | Attending: Radiation Oncology | Admitting: Radiation Oncology

## 2021-09-18 DIAGNOSIS — Z510 Encounter for antineoplastic radiation therapy: Secondary | ICD-10-CM | POA: Insufficient documentation

## 2021-09-18 DIAGNOSIS — C3490 Malignant neoplasm of unspecified part of unspecified bronchus or lung: Secondary | ICD-10-CM | POA: Diagnosis present

## 2021-09-18 DIAGNOSIS — C7951 Secondary malignant neoplasm of bone: Secondary | ICD-10-CM | POA: Diagnosis present

## 2021-09-18 NOTE — Progress Notes (Signed)
I spoke to patient regarding the results of the brain MRIre: solitary brain metastasis. He was aware of the MRI results earlier as discussed by [facility]. Discussed evaluation with a Radiation. Patient in agreement. Discussed with Hildred Alamin will refer to Dr. Donella Stade

## 2021-09-18 NOTE — Progress Notes (Signed)
Met with patient after radiation treatment to review upcoming appts and discuss recent results from brain MRI. Per Dr. Jacinto Reap, pt will need to see Dr. Baruch Gouty to address new brain lesion and possible brain radiation. Reassurance provided. Informed pt that will call him to let him his appt details with Dr. Baruch Gouty. Pt verbalized understanding. Nothing further needed at this time. ?

## 2021-09-19 ENCOUNTER — Ambulatory Visit
Admission: RE | Admit: 2021-09-19 | Discharge: 2021-09-19 | Disposition: A | Discharge status: Home / Self Care | Payer: Medicaid Other | Source: Ambulatory Visit | Attending: Radiation Oncology | Admitting: Radiation Oncology

## 2021-09-19 ENCOUNTER — Ambulatory Visit: Payer: Medicaid Other

## 2021-09-19 DIAGNOSIS — Z510 Encounter for antineoplastic radiation therapy: Secondary | ICD-10-CM | POA: Diagnosis not present

## 2021-09-20 ENCOUNTER — Inpatient Hospital Stay: Payer: Medicaid Other

## 2021-09-20 ENCOUNTER — Ambulatory Visit: Payer: Medicaid Other

## 2021-09-20 ENCOUNTER — Ambulatory Visit
Admission: RE | Admit: 2021-09-20 | Discharge: 2021-09-20 | Disposition: A | Discharge status: Home / Self Care | Payer: Medicaid Other | Source: Ambulatory Visit | Attending: Radiation Oncology | Admitting: Radiation Oncology

## 2021-09-20 ENCOUNTER — Other Ambulatory Visit: Payer: Self-pay

## 2021-09-20 DIAGNOSIS — C7951 Secondary malignant neoplasm of bone: Secondary | ICD-10-CM | POA: Insufficient documentation

## 2021-09-20 DIAGNOSIS — C3412 Malignant neoplasm of upper lobe, left bronchus or lung: Secondary | ICD-10-CM | POA: Insufficient documentation

## 2021-09-20 DIAGNOSIS — Z510 Encounter for antineoplastic radiation therapy: Secondary | ICD-10-CM | POA: Diagnosis not present

## 2021-09-20 DIAGNOSIS — Z79899 Other long term (current) drug therapy: Secondary | ICD-10-CM | POA: Insufficient documentation

## 2021-09-20 DIAGNOSIS — Z5112 Encounter for antineoplastic immunotherapy: Secondary | ICD-10-CM | POA: Insufficient documentation

## 2021-09-20 DIAGNOSIS — Z5111 Encounter for antineoplastic chemotherapy: Secondary | ICD-10-CM | POA: Insufficient documentation

## 2021-09-23 ENCOUNTER — Ambulatory Visit
Admission: RE | Admit: 2021-09-23 | Discharge: 2021-09-23 | Disposition: A | Discharge status: Home / Self Care | Payer: Medicaid Other | Source: Ambulatory Visit | Attending: Radiation Oncology | Admitting: Radiation Oncology

## 2021-09-23 ENCOUNTER — Inpatient Hospital Stay: Payer: Medicaid Other

## 2021-09-23 ENCOUNTER — Other Ambulatory Visit: Payer: Self-pay

## 2021-09-23 ENCOUNTER — Other Ambulatory Visit: Payer: Self-pay | Admitting: *Deleted

## 2021-09-23 DIAGNOSIS — Z510 Encounter for antineoplastic radiation therapy: Secondary | ICD-10-CM | POA: Diagnosis not present

## 2021-09-23 MED ORDER — OXYCODONE HCL 10 MG PO TABS: 10.0000 mg | ORAL_TABLET | ORAL | 0 refills | Status: DC | PRN

## 2021-09-23 NOTE — Telephone Encounter (Signed)
Pt seen after final radiation treatment and requested refill of oxycodone be sent to [facility]. States has about 10 pills left. ?

## 2021-09-27 ENCOUNTER — Inpatient Hospital Stay: Payer: Medicaid Other

## 2021-09-27 ENCOUNTER — Inpatient Hospital Stay (HOSPITAL_BASED_OUTPATIENT_CLINIC_OR_DEPARTMENT_OTHER): Payer: Medicaid Other | Admitting: Internal Medicine

## 2021-09-27 ENCOUNTER — Other Ambulatory Visit: Payer: Self-pay

## 2021-09-27 ENCOUNTER — Inpatient Hospital Stay (HOSPITAL_BASED_OUTPATIENT_CLINIC_OR_DEPARTMENT_OTHER): Payer: Medicaid Other | Admitting: Hospice and Palliative Medicine

## 2021-09-27 DIAGNOSIS — Z510 Encounter for antineoplastic radiation therapy: Secondary | ICD-10-CM | POA: Diagnosis not present

## 2021-09-27 DIAGNOSIS — C3412 Malignant neoplasm of upper lobe, left bronchus or lung: Secondary | ICD-10-CM

## 2021-09-27 DIAGNOSIS — C7951 Secondary malignant neoplasm of bone: Secondary | ICD-10-CM

## 2021-09-27 LAB — COMPREHENSIVE METABOLIC PANEL
ALT: 9 U/L (ref 0–44)
AST: 17 U/L (ref 15–41)
Albumin: 3.2 g/dL — ABNORMAL LOW (ref 3.5–5.0)
Alkaline Phosphatase: 49 U/L (ref 38–126)
Anion gap: 6 (ref 5–15)
BUN: 7 mg/dL (ref 6–20)
CO2: 28 mmol/L (ref 22–32)
Calcium: 8.7 mg/dL — ABNORMAL LOW (ref 8.9–10.3)
Chloride: 105 mmol/L (ref 98–111)
Creatinine, Ser: 0.83 mg/dL (ref 0.61–1.24)
GFR, Estimated: >60 mL/min (ref >60)
Glucose, Bld: 105 mg/dL — ABNORMAL HIGH (ref 70–99)
Potassium: 3.5 mmol/L (ref 3.5–5.1)
Sodium: 139 mmol/L (ref 135–145)
Total Bilirubin: 0.5 mg/dL (ref 0.3–1.2)
Total Protein: 7.6 g/dL (ref 6.5–8.1)

## 2021-09-27 LAB — CBC WITH DIFFERENTIAL/PLATELET
Abs Immature Granulocytes: 0.04 10*3/uL (ref 0.00–0.07)
Basophils Absolute: 0 10*3/uL (ref 0.0–0.1)
Basophils Relative: 0 %
Eosinophils Absolute: 0.1 10*3/uL (ref 0.0–0.5)
Eosinophils Relative: 2 %
HCT: 36.2 % — ABNORMAL LOW (ref 39.0–52.0)
Hemoglobin: 11.9 g/dL — ABNORMAL LOW (ref 13.0–17.0)
Immature Granulocytes: 1 %
Lymphocytes Relative: 14 %
Lymphs Abs: 1 10*3/uL (ref 0.7–4.0)
MCH: 29.7 pg (ref 26.0–34.0)
MCHC: 32.9 g/dL (ref 30.0–36.0)
MCV: 90.3 fL (ref 80.0–100.0)
Monocytes Absolute: 0.8 10*3/uL (ref 0.1–1.0)
Monocytes Relative: 12 %
Neutro Abs: 5.2 10*3/uL (ref 1.7–7.7)
Neutrophils Relative %: 71 %
Platelets: 336 10*3/uL (ref 150–400)
RBC: 4.01 MIL/uL — ABNORMAL LOW (ref 4.22–5.81)
RDW: 15.8 % — ABNORMAL HIGH (ref 11.5–15.5)
WBC: 7.3 10*3/uL (ref 4.0–10.5)
nRBC: 0 % (ref 0.0–0.2)

## 2021-09-27 MED ORDER — SODIUM CHLORIDE 0.9 % IV SOLN
150.0000 mg | Freq: Once | INTRAVENOUS | Status: AC
  Administered 2021-09-27: 150 mg via INTRAVENOUS
  Filled 2021-09-27: qty 150

## 2021-09-27 MED ORDER — SODIUM CHLORIDE 0.9 % IV SOLN
500.0000 mg/m2 | Freq: Once | INTRAVENOUS | Status: AC
  Administered 2021-09-27: 1000 mg via INTRAVENOUS
  Filled 2021-09-27: qty 40

## 2021-09-27 MED ORDER — SODIUM CHLORIDE 0.9 % IV SOLN
15.0000 mg/kg | Freq: Once | INTRAVENOUS | Status: AC
  Administered 2021-09-27: 1200 mg via INTRAVENOUS
  Filled 2021-09-27: qty 48

## 2021-09-27 MED ORDER — SODIUM CHLORIDE 0.9 % IV SOLN
620.0000 mg | Freq: Once | INTRAVENOUS | Status: AC
  Administered 2021-09-27: 620 mg via INTRAVENOUS
  Filled 2021-09-27: qty 62

## 2021-09-27 MED ORDER — PALONOSETRON HCL INJECTION 0.25 MG/5ML
0.2500 mg | Freq: Once | INTRAVENOUS | Status: AC
  Administered 2021-09-27: 0.25 mg via INTRAVENOUS
  Filled 2021-09-27: qty 5

## 2021-09-27 MED ORDER — SODIUM CHLORIDE 0.9 % IV SOLN
Freq: Once | INTRAVENOUS | Status: AC
  Filled 2021-09-27: qty 250

## 2021-09-27 MED ORDER — ACETAMINOPHEN 325 MG PO TABS
650.0000 mg | ORAL_TABLET | Freq: Once | ORAL | Status: AC
  Administered 2021-09-27: 650 mg via ORAL
  Filled 2021-09-27: qty 2

## 2021-09-27 MED ORDER — SODIUM CHLORIDE 0.9 % IV SOLN: 4.0000 mg | Freq: Once | INTRAVENOUS | Status: DC

## 2021-09-27 MED ORDER — OXYCODONE HCL 5 MG PO TABS
10.0000 mg | ORAL_TABLET | Freq: Once | ORAL | Status: AC
  Administered 2021-09-27: 10 mg via ORAL
  Filled 2021-09-27: qty 2

## 2021-09-27 MED ORDER — DEXAMETHASONE 2 MG PO TABS: 2.0000 mg | ORAL_TABLET | Freq: Every day | ORAL | 0 refills | Status: DC

## 2021-09-27 MED ORDER — DEXAMETHASONE SODIUM PHOSPHATE 10 MG/ML IJ SOLN
4.0000 mg | Freq: Once | INTRAMUSCULAR | Status: AC
  Administered 2021-09-27: 4 mg via INTRAVENOUS
  Filled 2021-09-27: qty 1

## 2021-09-27 NOTE — Assessment & Plan Note (Addendum)
#  Stage IV lung cancer -adenocarcinoma lung primary.-bilateral lung masses - left upper lobe [5.3 x 2.5 cm ] and right middle/upper lobe [4.5 x 4.0 cm]- Lytic destructive lesion involving the inferior angle of the right scapula, concerning for a metastasis.Indeterminate hypodense right hepatic lobe lesion measuring 1.1 cm.?JAN 2023- PET scan-confirms above malignant findings.  S/p biopsy  NGS/PD-L1 pending. Await to reschedule MRI brain. [Given Hx of MS-? CI to immunotherapy]. ? ?# Proceed with chemo- carboplatin Alimta Avastin- cycle #2. Labs today reviewed;  acceptable for treatment today.  Plan staging after 3 cycles of chemo.  ? 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 Start low dose dex 65m -dw Josh.   ?? ?# multiple sclerosis-on Avonex  SQ. discussed with Dr. SManuella Ghazi neurology-states multiple sclerosis- STABLE or worse- see below.   ? ?# Pain right scapula-second toe metastatic malignancy - started on RT [Jan 30th-Feb, 13th]- poorly controlled on oxycodone 10- mg every 12 hours.; on fenatnyl Ptach 562m;  Consider zometa [needs dental clearance].   ? ?# Smoking: Recommended patient quit smoking.  ? ?# hiccups- ?  Dexamethasone s/p evaluation with Josh Borders.  Currently on Thorazine [Josh]; decrease dexamethasone to 4 mg premed IV.  ? ?# Incidental findings on Imaging  MRI FEB , 2023:2. There is underlying chronic demyelinating disease and ?posttraumatic appearing encephalomalacia in both anterior frontal and the left temporal lobes. I reviewed/discussed/counseled the patient.  ? ?# IV access: declines port [on bev] ? ?*pt cant drive/ [employer]er ? ?* [id number] cycles ?# DISPOSITION: ?# chemo today ? #in 3 weeks- MD: labs- cbc/cmp;CarboAlitma-Avastin; [facility]- Dr.B   ? ?# I reviewed the blood work- with the patient in detail; also reviewed  the imaging independently [as summarized above]; and with the patient in detail.  ? ? ? ?? ?

## 2021-09-27 NOTE — Progress Notes (Signed)
Patient reports a constant headache, 10/10 pain.  Fentanyl patch is due to replace today but forgot to reapply this morning.  Has a 9 lb wt gain in the last 3 weeks. ?
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
## 2021-09-27 NOTE — Progress Notes (Signed)
Ok to tx per MD without urine from today.  ?

## 2021-09-27 NOTE — Patient Instructions (Signed)
[facility] CANCER CTR AT [place]  Discharge Instructions: ?Thank you for choosing [facility] to provide your oncology and hematology care.  ?If you have a lab appointment with the [facility], please go directly to the [facility] and check in at the registration area. ? ?Wear comfortable clothing and clothing appropriate for easy access to any Portacath or PICC line.  ? ?We strive to give you quality time with your provider. You may need to reschedule your appointment if you arrive late (15 or more minutes).  Arriving late affects you and other patients whose appointments are after yours.  Also, if you miss three or more appointments without notifying the office, you may be dismissed from the clinic at the provider?s discretion.    ?  ?For prescription refill requests, have your pharmacy contact our office and allow 72 hours for refills to be completed.   ? ?Today you received the following chemotherapy and/or immunotherapy agents: Carboplatin ? Avastin / Alimta ?  ?To help prevent nausea and vomiting after your treatment, we encourage you to take your nausea medication as directed. ? ?BELOW ARE SYMPTOMS THAT SHOULD BE REPORTED IMMEDIATELY: ?*FEVER GREATER THAN 100.4 F (38 ?C) OR HIGHER ?*CHILLS OR SWEATING ?*NAUSEA AND VOMITING THAT IS NOT CONTROLLED WITH YOUR NAUSEA MEDICATION ?*UNUSUAL SHORTNESS OF BREATH ?*UNUSUAL BRUISING OR BLEEDING ?*URINARY PROBLEMS (pain or burning when urinating, or frequent urination) ?*BOWEL PROBLEMS (unusual diarrhea, constipation, pain near the anus) ?TENDERNESS IN MOUTH AND THROAT WITH OR WITHOUT PRESENCE OF ULCERS (sore throat, sores in mouth, or a toothache) ?UNUSUAL RASH, SWELLING OR PAIN  ?UNUSUAL VAGINAL DISCHARGE OR ITCHING  ? ?Items with * indicate a potential emergency and should be followed up as soon as possible or go to the Emergency Department if any problems should occur. ? ?Please show the CHEMOTHERAPY ALERT CARD or IMMUNOTHERAPY ALERT  CARD at check-in to the Emergency Department and triage nurse. ? ?Should you have questions after your visit or need to cancel or reschedule your appointment, please contact [facility] CANCER [place] AT [place]  [phone] and follow the prompts.  Office hours are 8:00 a.m. to 4:30 p.m. Monday - Friday. Please note that voicemails left after 4:00 p.m. may not be returned until the following business day.  We are closed weekends and major holidays. You have access to a nurse at all times for urgent questions. Please call the main number to the clinic [phone] and follow the prompts. ? ?For any non-urgent questions, you may also contact your provider using MyChart. We now offer e-Visits for anyone 38 and older to request care online for non-urgent symptoms. For details visit [email or web address]. ?  ?Also download the MyChart app! Go to the app store, search "MyChart", open the app, select [facility], and log in with your MyChart username and password. ? ?Due to Covid, a mask is required upon entering the hospital/clinic. If you do not have a mask, one will be given to you upon arrival. For doctor visits, patients may have 1 support person aged 73 or older with them. For treatment visits, patients cannot have anyone with them due to current Covid guidelines and our immunocompromised population.  ?

## 2021-09-30 ENCOUNTER — Other Ambulatory Visit: Payer: Self-pay

## 2021-09-30 ENCOUNTER — Ambulatory Visit
Admission: RE | Admit: 2021-09-30 | Discharge: 2021-09-30 | Disposition: A | Discharge status: Home / Self Care | Payer: Medicaid Other | Source: Ambulatory Visit | Attending: Radiation Oncology | Admitting: Radiation Oncology

## 2021-09-30 ENCOUNTER — Inpatient Hospital Stay: Payer: Medicaid Other

## 2021-09-30 DIAGNOSIS — Z510 Encounter for antineoplastic radiation therapy: Secondary | ICD-10-CM | POA: Diagnosis not present

## 2021-10-02 ENCOUNTER — Telehealth: Payer: Medicaid Other | Admitting: Hospice and Palliative Medicine

## 2021-10-03 ENCOUNTER — Other Ambulatory Visit: Payer: Self-pay | Admitting: *Deleted

## 2021-10-03 DIAGNOSIS — Z510 Encounter for antineoplastic radiation therapy: Secondary | ICD-10-CM | POA: Diagnosis not present

## 2021-10-03 MED ORDER — OXYCODONE HCL 10 MG PO TABS: 10.0000 mg | ORAL_TABLET | ORAL | 0 refills | Status: DC | PRN

## 2021-10-08 ENCOUNTER — Ambulatory Visit: Admission: RE | Admit: 2021-10-08 | Payer: Medicaid Other | Source: Ambulatory Visit

## 2021-10-09 ENCOUNTER — Ambulatory Visit
Admission: RE | Admit: 2021-10-09 | Discharge: 2021-10-09 | Disposition: A | Discharge status: Home / Self Care | Payer: Medicaid Other | Source: Ambulatory Visit | Attending: Radiation Oncology | Admitting: Radiation Oncology

## 2021-10-09 ENCOUNTER — Ambulatory Visit: Payer: Medicaid Other | Admitting: Radiation Oncology

## 2021-10-09 DIAGNOSIS — Z510 Encounter for antineoplastic radiation therapy: Secondary | ICD-10-CM | POA: Diagnosis not present

## 2021-10-10 ENCOUNTER — Ambulatory Visit
Admission: RE | Admit: 2021-10-10 | Discharge: 2021-10-10 | Disposition: A | Discharge status: Home / Self Care | Payer: Medicaid Other | Source: Ambulatory Visit | Attending: Radiation Oncology | Admitting: Radiation Oncology

## 2021-10-10 DIAGNOSIS — Z510 Encounter for antineoplastic radiation therapy: Secondary | ICD-10-CM | POA: Diagnosis not present

## 2021-10-11 ENCOUNTER — Ambulatory Visit: Payer: Medicaid Other

## 2021-10-11 ENCOUNTER — Ambulatory Visit
Admission: RE | Admit: 2021-10-11 | Discharge: 2021-10-11 | Disposition: A | Discharge status: Home / Self Care | Payer: Medicaid Other | Source: Ambulatory Visit | Attending: Radiation Oncology | Admitting: Radiation Oncology

## 2021-10-11 DIAGNOSIS — Z510 Encounter for antineoplastic radiation therapy: Secondary | ICD-10-CM | POA: Diagnosis not present

## 2021-10-14 ENCOUNTER — Ambulatory Visit: Payer: Medicaid Other

## 2021-10-14 ENCOUNTER — Other Ambulatory Visit: Payer: Self-pay | Admitting: *Deleted

## 2021-10-14 MED ORDER — OXYCODONE HCL 10 MG PO TABS: 10.0000 mg | ORAL_TABLET | ORAL | 0 refills | Status: DC | PRN

## 2021-10-14 MED ORDER — FENTANYL 50 MCG/HR TD PT72: 1.0000 | MEDICATED_PATCH | TRANSDERMAL | 0 refills | Status: DC

## 2021-10-14 NOTE — Telephone Encounter (Signed)
Pt called in to request refills of oxycodone and fentanyl patches sent to [facility] pharmacy. ?

## 2021-10-15 ENCOUNTER — Ambulatory Visit: Payer: Medicaid Other

## 2021-10-15 ENCOUNTER — Ambulatory Visit
Admission: RE | Admit: 2021-10-15 | Discharge: 2021-10-15 | Disposition: A | Discharge status: Home / Self Care | Payer: Medicaid Other | Source: Ambulatory Visit | Attending: Radiation Oncology | Admitting: Radiation Oncology

## 2021-10-15 ENCOUNTER — Other Ambulatory Visit: Payer: Self-pay | Admitting: *Deleted

## 2021-10-15 DIAGNOSIS — Z510 Encounter for antineoplastic radiation therapy: Secondary | ICD-10-CM | POA: Diagnosis not present

## 2021-10-15 MED ORDER — FENTANYL 25 MCG/HR TD PT72: 2.0000 | MEDICATED_PATCH | TRANSDERMAL | 0 refills | Status: DC

## 2021-10-15 NOTE — Telephone Encounter (Signed)
They do have 25 mcg and 100 mcg in stock, FYI 75 mcg as well as the 50 mcg patches are on back order.I will pend prescription for the 25 mcg patches for you to sign ?

## 2021-10-15 NOTE — Telephone Encounter (Signed)
[facility] called reporting that Fentanyl 50 mcg patches are on national backorder and is asking for consideration of an different medicine as they do not have any in stock ?

## 2021-10-16 ENCOUNTER — Ambulatory Visit
Admission: RE | Admit: 2021-10-16 | Discharge: 2021-10-16 | Disposition: A | Discharge status: Home / Self Care | Payer: Medicaid Other | Source: Ambulatory Visit | Attending: Radiation Oncology | Admitting: Radiation Oncology

## 2021-10-16 ENCOUNTER — Ambulatory Visit: Payer: Medicaid Other

## 2021-10-16 DIAGNOSIS — Z510 Encounter for antineoplastic radiation therapy: Secondary | ICD-10-CM | POA: Diagnosis not present

## 2021-10-18 ENCOUNTER — Inpatient Hospital Stay (HOSPITAL_BASED_OUTPATIENT_CLINIC_OR_DEPARTMENT_OTHER): Payer: Medicaid Other | Admitting: Oncology

## 2021-10-18 ENCOUNTER — Inpatient Hospital Stay: Payer: Medicaid Other

## 2021-10-18 ENCOUNTER — Encounter: Payer: Self-pay | Admitting: Oncology

## 2021-10-18 ENCOUNTER — Encounter: Payer: Self-pay | Admitting: *Deleted

## 2021-10-18 VITALS — BP 142/81 | HR 85 | Temp 96.4°F | Wt 173.0 lb

## 2021-10-18 DIAGNOSIS — C3412 Malignant neoplasm of upper lobe, left bronchus or lung: Secondary | ICD-10-CM | POA: Diagnosis not present

## 2021-10-18 DIAGNOSIS — Z510 Encounter for antineoplastic radiation therapy: Secondary | ICD-10-CM | POA: Diagnosis not present

## 2021-10-18 LAB — URINALYSIS, DIPSTICK ONLY
Bilirubin Urine: NEGATIVE
Glucose, UA: NEGATIVE mg/dL
Hgb urine dipstick: NEGATIVE
Ketones, ur: NEGATIVE mg/dL
Leukocytes,Ua: NEGATIVE
Nitrite: NEGATIVE
Protein, ur: NEGATIVE mg/dL
Specific Gravity, Urine: 1.011 (ref 1.005–1.030)
pH: 5 (ref 5.0–8.0)

## 2021-10-18 LAB — CBC WITH DIFFERENTIAL/PLATELET
Abs Immature Granulocytes: 0.04 10*3/uL (ref 0.00–0.07)
Basophils Absolute: 0 10*3/uL (ref 0.0–0.1)
Basophils Relative: 0 %
Eosinophils Absolute: 0.1 10*3/uL (ref 0.0–0.5)
Eosinophils Relative: 2 %
HCT: 39.8 % (ref 39.0–52.0)
Hemoglobin: 12.9 g/dL — ABNORMAL LOW (ref 13.0–17.0)
Immature Granulocytes: 1 %
Lymphocytes Relative: 18 %
Lymphs Abs: 1.4 10*3/uL (ref 0.7–4.0)
MCH: 30.1 pg (ref 26.0–34.0)
MCHC: 32.4 g/dL (ref 30.0–36.0)
MCV: 93 fL (ref 80.0–100.0)
Monocytes Absolute: 1 10*3/uL (ref 0.1–1.0)
Monocytes Relative: 12 %
Neutro Abs: 5.5 10*3/uL (ref 1.7–7.7)
Neutrophils Relative %: 67 %
Platelets: 362 10*3/uL (ref 150–400)
RBC: 4.28 MIL/uL (ref 4.22–5.81)
RDW: 17 % — ABNORMAL HIGH (ref 11.5–15.5)
WBC: 8.1 10*3/uL (ref 4.0–10.5)
nRBC: 0 % (ref 0.0–0.2)

## 2021-10-18 LAB — COMPREHENSIVE METABOLIC PANEL
ALT: 10 U/L (ref 0–44)
AST: 16 U/L (ref 15–41)
Albumin: 3.6 g/dL (ref 3.5–5.0)
Alkaline Phosphatase: 59 U/L (ref 38–126)
Anion gap: 7 (ref 5–15)
BUN: 10 mg/dL (ref 6–20)
CO2: 28 mmol/L (ref 22–32)
Calcium: 9.1 mg/dL (ref 8.9–10.3)
Chloride: 101 mmol/L (ref 98–111)
Creatinine, Ser: 0.88 mg/dL (ref 0.61–1.24)
GFR, Estimated: >60 mL/min (ref >60)
Glucose, Bld: 108 mg/dL — ABNORMAL HIGH (ref 70–99)
Potassium: 3.8 mmol/L (ref 3.5–5.1)
Sodium: 136 mmol/L (ref 135–145)
Total Bilirubin: 0.2 mg/dL — ABNORMAL LOW (ref 0.3–1.2)
Total Protein: 7.8 g/dL (ref 6.5–8.1)

## 2021-10-18 MED ORDER — DEXAMETHASONE SODIUM PHOSPHATE 10 MG/ML IJ SOLN
4.0000 mg | Freq: Once | INTRAMUSCULAR | Status: AC
  Administered 2021-10-18: 4 mg via INTRAVENOUS
  Filled 2021-10-18: qty 1

## 2021-10-18 MED ORDER — PALONOSETRON HCL INJECTION 0.25 MG/5ML
0.2500 mg | Freq: Once | INTRAVENOUS | Status: AC
  Administered 2021-10-18: 0.25 mg via INTRAVENOUS
  Filled 2021-10-18: qty 5

## 2021-10-18 MED ORDER — SODIUM CHLORIDE 0.9 % IV SOLN
620.0000 mg | Freq: Once | INTRAVENOUS | Status: AC
  Administered 2021-10-18: 620 mg via INTRAVENOUS
  Filled 2021-10-18: qty 62

## 2021-10-18 MED ORDER — OXYCODONE HCL 5 MG PO TABS
10.0000 mg | ORAL_TABLET | Freq: Once | ORAL | Status: AC
  Administered 2021-10-18: 10 mg via ORAL
  Filled 2021-10-18: qty 2

## 2021-10-18 MED ORDER — SODIUM CHLORIDE 0.9 % IV SOLN
500.0000 mg/m2 | Freq: Once | INTRAVENOUS | Status: AC
  Administered 2021-10-18: 1000 mg via INTRAVENOUS
  Filled 2021-10-18: qty 40

## 2021-10-18 MED ORDER — SODIUM CHLORIDE 0.9 % IV SOLN
150.0000 mg | Freq: Once | INTRAVENOUS | Status: AC
  Administered 2021-10-18: 150 mg via INTRAVENOUS
  Filled 2021-10-18: qty 150

## 2021-10-18 MED ORDER — SODIUM CHLORIDE 0.9 % IV SOLN
15.0000 mg/kg | Freq: Once | INTRAVENOUS | Status: AC
  Administered 2021-10-18: 1200 mg via INTRAVENOUS
  Filled 2021-10-18: qty 48

## 2021-10-18 MED ORDER — ACETAMINOPHEN 325 MG PO TABS
650.0000 mg | ORAL_TABLET | Freq: Once | ORAL | Status: AC
  Administered 2021-10-18: 650 mg via ORAL
  Filled 2021-10-18: qty 2

## 2021-10-18 MED ORDER — SODIUM CHLORIDE 0.9 % IV SOLN
Freq: Once | INTRAVENOUS | Status: AC
  Filled 2021-10-18: qty 250
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
## 2021-10-18 NOTE — Patient Instructions (Signed)
[facility] CANCER CTR AT [place]  Discharge Instructions: ?Thank you for choosing [facility] to provide your oncology and hematology care.  ?If you have a lab appointment with the [facility], please go directly to the [facility] and check in at the registration area. ? ?Wear comfortable clothing and clothing appropriate for easy access to any Portacath or PICC line.  ? ?We strive to give you quality time with your provider. You may need to reschedule your appointment if you arrive late (15 or more minutes).  Arriving late affects you and other patients whose appointments are after yours.  Also, if you miss three or more appointments without notifying the office, you may be dismissed from the clinic at the provider?s discretion.    ?  ?For prescription refill requests, have your pharmacy contact our office and allow 72 hours for refills to be completed.   ? ?Today you received the following chemotherapy and/or immunotherapy agents : Avastin / Carboplatin / Alimta   ?  ?To help prevent nausea and vomiting after your treatment, we encourage you to take your nausea medication as directed. ? ?BELOW ARE SYMPTOMS THAT SHOULD BE REPORTED IMMEDIATELY: ?*FEVER GREATER THAN 100.4 F (38 ?C) OR HIGHER ?*CHILLS OR SWEATING ?*NAUSEA AND VOMITING THAT IS NOT CONTROLLED WITH YOUR NAUSEA MEDICATION ?*UNUSUAL SHORTNESS OF BREATH ?*UNUSUAL BRUISING OR BLEEDING ?*URINARY PROBLEMS (pain or burning when urinating, or frequent urination) ?*BOWEL PROBLEMS (unusual diarrhea, constipation, pain near the anus) ?TENDERNESS IN MOUTH AND THROAT WITH OR WITHOUT PRESENCE OF ULCERS (sore throat, sores in mouth, or a toothache) ?UNUSUAL RASH, SWELLING OR PAIN  ?UNUSUAL VAGINAL DISCHARGE OR ITCHING  ? ?Items with * indicate a potential emergency and should be followed up as soon as possible or go to the Emergency Department if any problems should occur. ? ?Please show the CHEMOTHERAPY ALERT CARD or IMMUNOTHERAPY ALERT  CARD at check-in to the Emergency Department and triage nurse. ? ?Should you have questions after your visit or need to cancel or reschedule your appointment, please contact [facility] CANCER [place] AT [place]  [phone] and follow the prompts.  Office hours are 8:00 a.m. to 4:30 p.m. Monday - Friday. Please note that voicemails left after 4:00 p.m. may not be returned until the following business day.  We are closed weekends and major holidays. You have access to a nurse at all times for urgent questions. Please call the main number to the clinic [phone] and follow the prompts. ? ?For any non-urgent questions, you may also contact your provider using MyChart. We now offer e-Visits for anyone 67 and older to request care online for non-urgent symptoms. For details visit [email or web address]. ?  ?Also download the MyChart app! Go to the app store, search "MyChart", open the app, select [facility], and log in with your MyChart username and password. ? ?Due to Covid, a mask is required upon entering the hospital/clinic. If you do not have a mask, one will be given to you upon arrival. For doctor visits, patients may have 1 support person aged 26 or older with them. For treatment visits, patients cannot have anyone with them due to current Covid guidelines and our immunocompromised population.  ?

## 2021-10-28 ENCOUNTER — Other Ambulatory Visit: Payer: Self-pay | Admitting: *Deleted

## 2021-10-28 MED ORDER — OXYCODONE HCL 10 MG PO TABS: 10.0000 mg | ORAL_TABLET | ORAL | 0 refills | Status: DC | PRN

## 2021-10-28 NOTE — Telephone Encounter (Signed)
Pt requesting refill of oxycodone be sent into [facility]. ?

## 2021-11-05 ENCOUNTER — Other Ambulatory Visit: Payer: Self-pay | Admitting: *Deleted

## 2021-11-05 ENCOUNTER — Telehealth: Payer: Self-pay | Admitting: *Deleted

## 2021-11-05 NOTE — Telephone Encounter (Signed)
Pt stated that has been having episodes of cold chills. Does not report fever or any other symptoms except feeling cold. Pt advised to continue checking his temperature daily and to call if develops fever or any other symptom. Pt verbalized understanding. ?

## 2021-11-05 NOTE — Telephone Encounter (Signed)
Pt requests refill of oxycodone be sent into [facility]. States has had to take 2 tablets instead of just 1 to help relieve pain at this time.  ?

## 2021-11-06 ENCOUNTER — Telehealth: Payer: Self-pay | Admitting: Internal Medicine

## 2021-11-06 ENCOUNTER — Inpatient Hospital Stay: Payer: Medicaid Other

## 2021-11-06 ENCOUNTER — Encounter: Payer: Self-pay | Admitting: Internal Medicine

## 2021-11-06 ENCOUNTER — Ambulatory Visit: Payer: Medicaid Other

## 2021-11-06 MED ORDER — OXYCODONE HCL 10 MG PO TABS: 10.0000 mg | ORAL_TABLET | ORAL | 0 refills | Status: DC | PRN

## 2021-11-06 NOTE — Telephone Encounter (Signed)
spoke with pt, informed him of new CT Date of 04/28 and upcoming appt for Friday 04/21 KJ  ?

## 2021-11-07 ENCOUNTER — Other Ambulatory Visit: Payer: Self-pay | Admitting: Internal Medicine

## 2021-11-07 ENCOUNTER — Other Ambulatory Visit: Payer: Self-pay

## 2021-11-07 DIAGNOSIS — C3412 Malignant neoplasm of upper lobe, left bronchus or lung: Secondary | ICD-10-CM

## 2021-11-08 ENCOUNTER — Inpatient Hospital Stay: Payer: Medicaid Other

## 2021-11-08 ENCOUNTER — Telehealth: Payer: Self-pay

## 2021-11-08 ENCOUNTER — Inpatient Hospital Stay: Payer: Medicaid Other | Attending: Internal Medicine

## 2021-11-08 ENCOUNTER — Encounter: Payer: Self-pay | Admitting: Internal Medicine

## 2021-11-08 ENCOUNTER — Inpatient Hospital Stay (HOSPITAL_BASED_OUTPATIENT_CLINIC_OR_DEPARTMENT_OTHER): Payer: Medicaid Other | Admitting: Internal Medicine

## 2021-11-08 ENCOUNTER — Other Ambulatory Visit: Payer: Self-pay | Admitting: Internal Medicine

## 2021-11-08 VITALS — BP 137/91 | HR 77 | Temp 93.8°F | Ht 69.0 in | Wt 164.8 lb

## 2021-11-08 DIAGNOSIS — Z5111 Encounter for antineoplastic chemotherapy: Secondary | ICD-10-CM | POA: Insufficient documentation

## 2021-11-08 DIAGNOSIS — C3412 Malignant neoplasm of upper lobe, left bronchus or lung: Secondary | ICD-10-CM

## 2021-11-08 DIAGNOSIS — Z5112 Encounter for antineoplastic immunotherapy: Secondary | ICD-10-CM | POA: Diagnosis present

## 2021-11-08 DIAGNOSIS — Z79899 Other long term (current) drug therapy: Secondary | ICD-10-CM | POA: Diagnosis not present

## 2021-11-08 DIAGNOSIS — C787 Secondary malignant neoplasm of liver and intrahepatic bile duct: Secondary | ICD-10-CM | POA: Diagnosis not present

## 2021-11-08 DIAGNOSIS — C7951 Secondary malignant neoplasm of bone: Secondary | ICD-10-CM | POA: Diagnosis not present

## 2021-11-08 LAB — COMPREHENSIVE METABOLIC PANEL
ALT: 9 U/L (ref 0–44)
AST: 14 U/L — ABNORMAL LOW (ref 15–41)
Albumin: 3.4 g/dL — ABNORMAL LOW (ref 3.5–5.0)
Alkaline Phosphatase: 59 U/L (ref 38–126)
Anion gap: 7 (ref 5–15)
BUN: 7 mg/dL (ref 6–20)
CO2: 27 mmol/L (ref 22–32)
Calcium: 9.2 mg/dL (ref 8.9–10.3)
Chloride: 101 mmol/L (ref 98–111)
Creatinine, Ser: 1.04 mg/dL (ref 0.61–1.24)
GFR, Estimated: >60 mL/min (ref >60)
Glucose, Bld: 100 mg/dL — ABNORMAL HIGH (ref 70–99)
Potassium: 4.6 mmol/L (ref 3.5–5.1)
Sodium: 135 mmol/L (ref 135–145)
Total Bilirubin: 0.3 mg/dL (ref 0.3–1.2)
Total Protein: 8.2 g/dL — ABNORMAL HIGH (ref 6.5–8.1)

## 2021-11-08 LAB — CBC WITH DIFFERENTIAL/PLATELET
Abs Immature Granulocytes: 0.07 10*3/uL (ref 0.00–0.07)
Basophils Absolute: 0 10*3/uL (ref 0.0–0.1)
Basophils Relative: 0 %
Eosinophils Absolute: 0.1 10*3/uL (ref 0.0–0.5)
Eosinophils Relative: 1 %
HCT: 38 % — ABNORMAL LOW (ref 39.0–52.0)
Hemoglobin: 12.1 g/dL — ABNORMAL LOW (ref 13.0–17.0)
Immature Granulocytes: 1 %
Lymphocytes Relative: 19 %
Lymphs Abs: 1.5 10*3/uL (ref 0.7–4.0)
MCH: 30.3 pg (ref 26.0–34.0)
MCHC: 31.8 g/dL (ref 30.0–36.0)
MCV: 95.2 fL (ref 80.0–100.0)
Monocytes Absolute: 1 10*3/uL (ref 0.1–1.0)
Monocytes Relative: 12 %
Neutro Abs: 5.2 10*3/uL (ref 1.7–7.7)
Neutrophils Relative %: 67 %
Platelets: 295 10*3/uL (ref 150–400)
RBC: 3.99 MIL/uL — ABNORMAL LOW (ref 4.22–5.81)
RDW: 19.3 % — ABNORMAL HIGH (ref 11.5–15.5)
WBC: 7.8 10*3/uL (ref 4.0–10.5)
nRBC: 0 % (ref 0.0–0.2)

## 2021-11-08 MED ORDER — SODIUM CHLORIDE 0.9 % IV SOLN
150.0000 mg | Freq: Once | INTRAVENOUS | Status: AC
  Administered 2021-11-08: 150 mg via INTRAVENOUS
  Filled 2021-11-08: qty 150

## 2021-11-08 MED ORDER — SODIUM CHLORIDE 0.9 % IV SOLN
15.0000 mg/kg | Freq: Once | INTRAVENOUS | Status: AC
  Administered 2021-11-08: 1200 mg via INTRAVENOUS
  Filled 2021-11-08: qty 48

## 2021-11-08 MED ORDER — ACETAMINOPHEN 325 MG PO TABS
650.0000 mg | ORAL_TABLET | Freq: Once | ORAL | Status: AC
  Administered 2021-11-08: 650 mg via ORAL
  Filled 2021-11-08: qty 2

## 2021-11-08 MED ORDER — DEXAMETHASONE SODIUM PHOSPHATE 10 MG/ML IJ SOLN
4.0000 mg | Freq: Once | INTRAMUSCULAR | Status: AC
  Administered 2021-11-08: 4 mg via INTRAVENOUS
  Filled 2021-11-08: qty 1

## 2021-11-08 MED ORDER — SODIUM CHLORIDE 0.9 % IV SOLN
550.0000 mg | Freq: Once | INTRAVENOUS | Status: AC
  Administered 2021-11-08: 550 mg via INTRAVENOUS
  Filled 2021-11-08: qty 55

## 2021-11-08 MED ORDER — SODIUM CHLORIDE 0.9 % IV SOLN
500.0000 mg/m2 | Freq: Once | INTRAVENOUS | Status: AC
  Administered 2021-11-08: 1000 mg via INTRAVENOUS
  Filled 2021-11-08: qty 40

## 2021-11-08 MED ORDER — CYANOCOBALAMIN 1000 MCG/ML IJ SOLN
1000.0000 ug | Freq: Once | INTRAMUSCULAR | Status: AC
  Administered 2021-11-08: 1000 ug via INTRAMUSCULAR
  Filled 2021-11-08: qty 1

## 2021-11-08 MED ORDER — PALONOSETRON HCL INJECTION 0.25 MG/5ML
0.2500 mg | Freq: Once | INTRAVENOUS | Status: AC
  Administered 2021-11-08: 0.25 mg via INTRAVENOUS
  Filled 2021-11-08: qty 5

## 2021-11-08 MED ORDER — SODIUM CHLORIDE 0.9 % IV SOLN
Freq: Once | INTRAVENOUS | Status: AC
  Filled 2021-11-08: qty 250

## 2021-11-08 NOTE — Telephone Encounter (Signed)
Schedule request for port a cath placement faxed to specialty scheduling.   
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
## 2021-11-08 NOTE — Telephone Encounter (Signed)
Message received from specialty scheduling with an appt date and time.  When I called patient to confirm if the date/time would work for him he stated that he does not want to pursue getting port placed right now but will call the office when he is ready to have it scheduled.   ?

## 2021-11-08 NOTE — Progress Notes (Signed)
C/o staying cold and sweaty, even when covered in blankets. ?

## 2021-11-08 NOTE — Patient Instructions (Signed)
[facility] CANCER CTR AT [place]  Discharge Instructions: ?Thank you for choosing [place] to provide your oncology and hematology care.  ?If you have a lab appointment with the [place], please go directly to the [place] and check in at the registration area. ? ?Wear comfortable clothing and clothing appropriate for easy access to any Portacath or PICC line.  ? ?We strive to give you quality time with your provider. You may need to reschedule your appointment if you arrive late (15 or more minutes).  Arriving late affects you and other patients whose appointments are after yours.  Also, if you miss three or more appointments without notifying the office, you may be dismissed from the clinic at the provider?s discretion.    ?  ?For prescription refill requests, have your pharmacy contact our office and allow 72 hours for refills to be completed.   ? ?Today you received the following chemotherapy and/or immunotherapy agents Zirabev, Pemetrexed and Carboplatin    ?  ?To help prevent nausea and vomiting after your treatment, we encourage you to take your nausea medication as directed. ? ?BELOW ARE SYMPTOMS THAT SHOULD BE REPORTED IMMEDIATELY: ?*FEVER GREATER THAN 100.4 F (38 ?C) OR HIGHER ?*CHILLS OR SWEATING ?*NAUSEA AND VOMITING THAT IS NOT CONTROLLED WITH YOUR NAUSEA MEDICATION ?*UNUSUAL SHORTNESS OF BREATH ?*UNUSUAL BRUISING OR BLEEDING ?*URINARY PROBLEMS (pain or burning when urinating, or frequent urination) ?*BOWEL PROBLEMS (unusual diarrhea, constipation, pain near the anus) ?TENDERNESS IN MOUTH AND THROAT WITH OR WITHOUT PRESENCE OF ULCERS (sore throat, sores in mouth, or a toothache) ?UNUSUAL RASH, SWELLING OR PAIN  ?UNUSUAL VAGINAL DISCHARGE OR ITCHING  ? ?Items with * indicate a potential emergency and should be followed up as soon as possible or go to the Emergency Department if any problems should occur. ? ?Please show the CHEMOTHERAPY ALERT CARD or IMMUNOTHERAPY  ALERT CARD at check-in to the Emergency Department and triage nurse. ? ?Should you have questions after your visit or need to cancel or reschedule your appointment, please contact [facility] CANCER [place] AT [place]  [phone] and follow the prompts.  Office hours are 8:00 a.m. to 4:30 p.m. Monday - Friday. Please note that voicemails left after 4:00 p.m. may not be returned until the following business day.  We are closed weekends and major holidays. You have access to a nurse at all times for urgent questions. Please call the main number to the clinic [phone] and follow the prompts. ? ?For any non-urgent questions, you may also contact your provider using MyChart. We now offer e-Visits for anyone 29 and older to request care online for non-urgent symptoms. For details visit [email or web address]. ?  ?Also download the MyChart app! Go to the app store, search "MyChart", open the app, select [facility] [place], and log in with your MyChart username and password. ? ?Due to Covid, a mask is required upon entering the hospital/clinic. If you do not have a mask, one will be given to you upon arrival. For doctor visits, patients may have 1 support person aged 58 or older with them. For treatment visits, patients cannot have anyone with them due to current Covid guidelines and our immunocompromised population.  ?

## 2021-11-08 NOTE — Progress Notes (Signed)
B/P 137/91, per Duwayne Heck CMA per Dr. Rogue Bussing, okay to proceed with treatment as scheduled. ?

## 2021-11-08 NOTE — Assessment & Plan Note (Addendum)
#  Stage IV lung cancer -adenocarcinoma lung primary.-bilateral lung masses - left upper lobe [5.3 x 2.5 cm ] and right middle/upper lobe [4.5 x 4.0 cm]- Lytic destructive lesion involving the inferior angle of the right scapula, concerning for a metastasis.Indeterminate hypodense right hepatic lobe lesion measuring 1.1 cm.?JAN 2023- PET scan-confirms above malignant findings.  S/p biopsy  NGS/PD-L1 pending.[Given Hx of MS-? CI to immunotherapy]. ? ?# Proceed with chemo- carboplatin Alimta Avastin- cycle #4. Labs today reviewed;  acceptable for treatment today.  Awaiting re- staging on 4/28.  ? ?# Chills- random; no fevers-? Sec to chemo; no infection.  recommend Tyelnol/benadryl prn.  ? ?#Elevated blood pressure-diastolic above 628; repeat blood pressure 137/91-proceed with Avastin today.  Monitor blood pressure closely ? 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 Will repeat scans in 2 months/May 2023.  ?? ?# multiple sclerosis-on Avonex  SQ. discussed with Dr. Manuella Ghazi; neurology-states multiple sclerosis- STABLE.   ? ?# Pain right scapula-second toe metastatic malignancy - started on RT [Jan 30th-Feb, 13th]- poorly controlled on oxycodone 10- mg  Up to 4 times a day; on fenatnyl Ptach 55mg;  Consider zometa [needs dental clearance].   ? ?# Smoking: cutting down quit smoking- intermittently.  ? ?# IV access:will refer to IR re: portt [on bev] ? ?*pt cant drive/ uber ? ?* BM38w3 cycles ? ?# DISPOSITION: ?#  referral to IR Port placement re: IV access ?# chemo today; give B12 injection ? #in 3 weeks- MD: labs- cbc/cmp;Alitma-Avastin; UA- Dr.B   ? ? ? ? ?? ?

## 2021-11-11 ENCOUNTER — Encounter: Payer: Self-pay | Admitting: Internal Medicine

## 2021-11-15 ENCOUNTER — Ambulatory Visit
Admission: RE | Admit: 2021-11-15 | Discharge: 2021-11-15 | Disposition: A | Discharge status: Home / Self Care | Payer: Medicaid Other | Source: Ambulatory Visit | Attending: Oncology | Admitting: Oncology

## 2021-11-15 ENCOUNTER — Inpatient Hospital Stay: Payer: Medicaid Other

## 2021-11-15 DIAGNOSIS — C3412 Malignant neoplasm of upper lobe, left bronchus or lung: Secondary | ICD-10-CM | POA: Diagnosis present

## 2021-11-15 MED ORDER — IOHEXOL 300 MG/ML  SOLN
75.0000 mL | Freq: Once | INTRAMUSCULAR | Status: AC | PRN
  Administered 2021-11-15: 75 mL via INTRAVENOUS

## 2021-11-18 ENCOUNTER — Telehealth: Payer: Self-pay | Admitting: *Deleted

## 2021-11-18 ENCOUNTER — Other Ambulatory Visit: Payer: Self-pay | Admitting: *Deleted

## 2021-11-18 DIAGNOSIS — F17200 Nicotine dependence, unspecified, uncomplicated: Secondary | ICD-10-CM

## 2021-11-18 DIAGNOSIS — J449 Chronic obstructive pulmonary disease, unspecified: Secondary | ICD-10-CM

## 2021-11-18 DIAGNOSIS — C3412 Malignant neoplasm of upper lobe, left bronchus or lung: Secondary | ICD-10-CM

## 2021-11-18 DIAGNOSIS — J4489 Other specified chronic obstructive pulmonary disease: Secondary | ICD-10-CM

## 2021-11-18 DIAGNOSIS — R0689 Other abnormalities of breathing: Secondary | ICD-10-CM

## 2021-11-18 MED ORDER — FENTANYL 50 MCG/HR TD PT72: 1.0000 | MEDICATED_PATCH | TRANSDERMAL | 0 refills | Status: DC

## 2021-11-18 MED ORDER — OXYCODONE HCL 10 MG PO TABS
10.0000 mg | ORAL_TABLET | ORAL | 0 refills | Status: DC | PRN
Start: 2021-11-18 — End: 2021-11-29

## 2021-11-18 MED ORDER — ALBUTEROL SULFATE HFA 108 (90 BASE) MCG/ACT IN AERS: 2.0000 | INHALATION_SPRAY | RESPIRATORY_TRACT | 5 refills | Status: DC | PRN

## 2021-11-18 NOTE — Telephone Encounter (Signed)
Pt experiencing increased shortness of breath. Refill for albuterol inhaler sent into pharmacy per [facility]. Also recommends referral to pulmonologist for further follow up and evaluation. Referral placed and will arrange transportation for appt. Pt verbalized understanding and is agreeable.  ?

## 2021-11-20 ENCOUNTER — Encounter: Payer: Self-pay | Admitting: Radiation Oncology

## 2021-11-20 ENCOUNTER — Inpatient Hospital Stay: Payer: Medicaid Other | Attending: Internal Medicine

## 2021-11-20 ENCOUNTER — Ambulatory Visit
Admission: RE | Admit: 2021-11-20 | Discharge: 2021-11-20 | Disposition: A | Discharge status: Home / Self Care | Payer: Medicaid Other | Source: Ambulatory Visit | Attending: Radiation Oncology | Admitting: Radiation Oncology

## 2021-11-20 VITALS — BP 143/95 | HR 92 | Resp 20 | Ht 69.0 in | Wt 162.9 lb

## 2021-11-20 DIAGNOSIS — C3412 Malignant neoplasm of upper lobe, left bronchus or lung: Secondary | ICD-10-CM | POA: Insufficient documentation

## 2021-11-20 DIAGNOSIS — C7951 Secondary malignant neoplasm of bone: Secondary | ICD-10-CM | POA: Insufficient documentation

## 2021-11-20 DIAGNOSIS — C3480 Malignant neoplasm of overlapping sites of unspecified bronchus and lung: Secondary | ICD-10-CM

## 2021-11-20 DIAGNOSIS — Z5111 Encounter for antineoplastic chemotherapy: Secondary | ICD-10-CM | POA: Insufficient documentation

## 2021-11-20 DIAGNOSIS — C7931 Secondary malignant neoplasm of brain: Secondary | ICD-10-CM | POA: Diagnosis not present

## 2021-11-20 DIAGNOSIS — Z5112 Encounter for antineoplastic immunotherapy: Secondary | ICD-10-CM | POA: Insufficient documentation

## 2021-11-20 DIAGNOSIS — M25511 Pain in right shoulder: Secondary | ICD-10-CM | POA: Insufficient documentation

## 2021-11-20 DIAGNOSIS — Z923 Personal history of irradiation: Secondary | ICD-10-CM | POA: Diagnosis not present

## 2021-11-20 DIAGNOSIS — Z79899 Other long term (current) drug therapy: Secondary | ICD-10-CM | POA: Insufficient documentation

## 2021-11-20 DIAGNOSIS — G35 Multiple sclerosis: Secondary | ICD-10-CM | POA: Diagnosis not present

## 2021-11-20 NOTE — Progress Notes (Signed)
Radiation Oncology ?Follow up Note ? ?Name: Ryan Cannon   ?Date:   11/20/2021 ?MRN:  [id number] ?DOB: [birth year]  ? ? ?This 56 y.o. male presents to the clinic today for 1 month follow-up status post palliative radiation therapy to both his right scapula as well as brain for stage IV lung cancer. ? ?REFERRING PROVIDER: Vladimir Crofts, MD ? ?HPI: Patient is a 56 year old male now at 1 month having completed palliative radiation therapy to his both his brain and right scapula for stage IV metastatic lung cancer of the left upper lobe.  He is currently on carboplatinum Alimta and Avastin which she is tolerating well.  He still has pain in his right shoulder although there was significant lytic destruction of that scapula from metastatic disease.Marland Kitchen ?Patient also has multiple sclerosis is on Avonex they are considering Zometa. ?COMPLICATIONS OF TREATMENT: none ? ?FOLLOW UP COMPLIANCE: keeps appointments  ? ?PHYSICAL EXAM:  ?BP (!) 143/95 (BP Location: Left Arm, Patient Position: Sitting)   Pulse 92   Resp 20   Ht 5\' 9"  (1.753 m)   Wt 162 lb 14.4 oz (73.9 kg)   BMI 24.06 kg/m?  ?No change in crude visual fields motor or sensory in detail levels are equal and symmetric upper lower extremities there is still tenderness to palpation of his right scapula.  Well-developed well-nourished patient in NAD. HEENT reveals PERLA, EOMI, discs not visualized.  Oral cavity is clear. No oral mucosal lesions are identified. Neck is clear without evidence of cervical or supraclavicular adenopathy. Lungs are clear to A&P. Cardiac examination is essentially unremarkable with regular rate and rhythm without murmur rub or thrill. Abdomen is benign with no organomegaly or masses noted. Motor sensory and DTR levels are equal and symmetric in the upper and lower extremities. Cranial nerves II through XII are grossly intact. Proprioception is intact. No peripheral adenopathy or edema is identified. No motor or sensory levels are noted. Crude  visual fields are within normal range. ? ?RADIOLOGY RESULTS: No current films for review ? ?PLAN: Present time patient is stable currently undergoing chemotherapy with medical oncology.  I will turn follow-up care over to medical oncology.  I would be happy to reevaluate the patient anytime should further palliative treatment be indicated.  Patient knows to call with any concerns. ? ?I would like to take this opportunity to thank you for allowing me to participate in the care of your patient.. ?  ? Noreene Filbert, MD ? ?

## 2021-11-29 ENCOUNTER — Other Ambulatory Visit: Payer: Self-pay | Admitting: *Deleted

## 2021-11-29 ENCOUNTER — Inpatient Hospital Stay: Payer: Medicaid Other

## 2021-11-29 ENCOUNTER — Inpatient Hospital Stay (HOSPITAL_BASED_OUTPATIENT_CLINIC_OR_DEPARTMENT_OTHER): Payer: Medicaid Other | Admitting: Internal Medicine

## 2021-11-29 ENCOUNTER — Other Ambulatory Visit: Payer: Self-pay

## 2021-11-29 ENCOUNTER — Encounter: Payer: Self-pay | Admitting: Internal Medicine

## 2021-11-29 VITALS — BP 135/85 | HR 89 | Temp 95.7°F | Ht 69.0 in | Wt 160.8 lb

## 2021-11-29 DIAGNOSIS — C3412 Malignant neoplasm of upper lobe, left bronchus or lung: Secondary | ICD-10-CM

## 2021-11-29 DIAGNOSIS — C3490 Malignant neoplasm of unspecified part of unspecified bronchus or lung: Secondary | ICD-10-CM | POA: Diagnosis not present

## 2021-11-29 DIAGNOSIS — Z79899 Other long term (current) drug therapy: Secondary | ICD-10-CM | POA: Diagnosis not present

## 2021-11-29 DIAGNOSIS — C7931 Secondary malignant neoplasm of brain: Secondary | ICD-10-CM | POA: Diagnosis not present

## 2021-11-29 DIAGNOSIS — C7951 Secondary malignant neoplasm of bone: Secondary | ICD-10-CM | POA: Diagnosis not present

## 2021-11-29 DIAGNOSIS — Z5112 Encounter for antineoplastic immunotherapy: Secondary | ICD-10-CM | POA: Diagnosis present

## 2021-11-29 DIAGNOSIS — Z5111 Encounter for antineoplastic chemotherapy: Secondary | ICD-10-CM | POA: Diagnosis present

## 2021-11-29 LAB — CBC WITH DIFFERENTIAL/PLATELET
Abs Immature Granulocytes: 0.04 10*3/uL (ref 0.00–0.07)
Basophils Absolute: 0 10*3/uL (ref 0.0–0.1)
Basophils Relative: 0 %
Eosinophils Absolute: 0.1 10*3/uL (ref 0.0–0.5)
Eosinophils Relative: 1 %
HCT: 36.3 % — ABNORMAL LOW (ref 39.0–52.0)
Hemoglobin: 11.6 g/dL — ABNORMAL LOW (ref 13.0–17.0)
Immature Granulocytes: 0 %
Lymphocytes Relative: 13 %
Lymphs Abs: 1.3 10*3/uL (ref 0.7–4.0)
MCH: 31.3 pg (ref 26.0–34.0)
MCHC: 32 g/dL (ref 30.0–36.0)
MCV: 97.8 fL (ref 80.0–100.0)
Monocytes Absolute: 1.2 10*3/uL — ABNORMAL HIGH (ref 0.1–1.0)
Monocytes Relative: 12 %
Neutro Abs: 7.4 10*3/uL (ref 1.7–7.7)
Neutrophils Relative %: 74 %
Platelets: 308 10*3/uL (ref 150–400)
RBC: 3.71 MIL/uL — ABNORMAL LOW (ref 4.22–5.81)
RDW: 21.2 % — ABNORMAL HIGH (ref 11.5–15.5)
WBC: 10.1 10*3/uL (ref 4.0–10.5)
nRBC: 0 % (ref 0.0–0.2)

## 2021-11-29 LAB — URINALYSIS, DIPSTICK ONLY
Bilirubin Urine: NEGATIVE
Glucose, UA: NEGATIVE mg/dL
Hgb urine dipstick: NEGATIVE
Ketones, ur: 5 mg/dL — AB
Leukocytes,Ua: NEGATIVE
Nitrite: NEGATIVE
Protein, ur: NEGATIVE mg/dL
Specific Gravity, Urine: 1.026 (ref 1.005–1.030)
pH: 5 (ref 5.0–8.0)

## 2021-11-29 LAB — COMPREHENSIVE METABOLIC PANEL
ALT: 8 U/L (ref 0–44)
AST: 19 U/L (ref 15–41)
Albumin: 3.6 g/dL (ref 3.5–5.0)
Alkaline Phosphatase: 56 U/L (ref 38–126)
Anion gap: 11 (ref 5–15)
BUN: 12 mg/dL (ref 6–20)
CO2: 23 mmol/L (ref 22–32)
Calcium: 9 mg/dL (ref 8.9–10.3)
Chloride: 103 mmol/L (ref 98–111)
Creatinine, Ser: 0.93 mg/dL (ref 0.61–1.24)
GFR, Estimated: >60 mL/min (ref >60)
Glucose, Bld: 71 mg/dL (ref 70–99)
Potassium: 3.8 mmol/L (ref 3.5–5.1)
Sodium: 137 mmol/L (ref 135–145)
Total Bilirubin: 0.2 mg/dL — ABNORMAL LOW (ref 0.3–1.2)
Total Protein: 8.7 g/dL — ABNORMAL HIGH (ref 6.5–8.1)

## 2021-11-29 LAB — TSH: TSH: 1.802 u[IU]/mL (ref 0.350–4.500)

## 2021-11-29 MED ORDER — DEXAMETHASONE SODIUM PHOSPHATE 10 MG/ML IJ SOLN
4.0000 mg | Freq: Once | INTRAMUSCULAR | Status: AC
  Administered 2021-11-29: 4 mg via INTRAVENOUS
  Filled 2021-11-29: qty 1

## 2021-11-29 MED ORDER — SODIUM CHLORIDE 0.9 % IV SOLN
500.0000 mg/m2 | Freq: Once | INTRAVENOUS | Status: AC
  Administered 2021-11-29: 1000 mg via INTRAVENOUS
  Filled 2021-11-29: qty 40

## 2021-11-29 MED ORDER — OXYCODONE HCL 10 MG PO TABS: 10.0000 mg | ORAL_TABLET | Freq: Three times a day (TID) | ORAL | 0 refills | Status: DC | PRN

## 2021-11-29 MED ORDER — SODIUM CHLORIDE 0.9 % IV SOLN
15.0000 mg/kg | Freq: Once | INTRAVENOUS | Status: AC
  Administered 2021-11-29: 1200 mg via INTRAVENOUS
  Filled 2021-11-29: qty 48

## 2021-11-29 MED ORDER — ACETAMINOPHEN 325 MG PO TABS
650.0000 mg | ORAL_TABLET | Freq: Once | ORAL | Status: AC
  Administered 2021-11-29: 650 mg via ORAL
  Filled 2021-11-29: qty 2

## 2021-11-29 MED ORDER — SODIUM CHLORIDE 0.9 % IV SOLN
Freq: Once | INTRAVENOUS | Status: AC
  Filled 2021-11-29: qty 250

## 2021-11-29 NOTE — Telephone Encounter (Signed)
Pt called into to have refill of oxycodone sent to [facility]. Informed pt that will send new request to provider to send into [facility]. Message left with [facility] pharmacy to cancel previous refill for oxycodone sent in.  ?

## 2021-11-29 NOTE — Progress Notes (Signed)
Having right shoulder pain 7/10. ? ?Would like to speak with Hayley if she is here today. ?

## 2021-11-29 NOTE — Assessment & Plan Note (Addendum)
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 CI to immunotherapy]. S/p carbo-alimta- CT scan April 28th, 2023-  Interval decrease in size of the right middle lobe and left upper lobe pulmonary lesions. No new or progressive findings. Interval decrease in size of the right hilar node also; Stable 15 mm right hepatic dome lesion.  No new hepatic lesions; Stable advanced destructive changes involving the right scapula. No new or progressive bony findings ? ?# Proceed with chemo-maintenance Alimta Avastin- cycle #1. Labs today reviewed;  acceptable for treatment today.  ? ?# Chills- random; no fevers-? Sec to chemo; no infection.  recommend Tyelnol/benadryl prn.  Stable.check TSH ? ?#Elevated blood pressure-diastolic above 543; repeat blood pressure 137/91-proceed with Avastin today.  Stable. ? 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 Will repeat MRI brain.  ?? ?# multiple sclerosis-on Avonex  SQ. discussed with Dr. Manuella Ghazi; neurology-states multiple sclerosis- STABLE.   ? ?# Pain right scapula-second toe metastatic malignancy - started on RT [Jan 30th-Feb, 13th]- on oxycodone 10- mg  Up to 4 times a day; on fenatnyl Ptach 42mcg;  Consider zometa [needs dental clearance].  Stable. ? ?# Smoking: cutting down quit smoking- intermittently.  ?# IV access: Declined port placement. ? ?*pt cant drive/ uber ? ?* E14 w3 cycles ? ?# DISPOSITION:  ?# chemo today;  ? #in 3 weeks- MD: labs- cbc/cmp;Alitma-Avastin; UA; MRI Brain  Prior- Dr.B   ? ? ? ? ?? ?

## 2021-11-29 NOTE — Patient Instructions (Signed)
[facility] CANCER CTR AT [place]  Discharge Instructions: ?Thank you for choosing [place] to provide your oncology and hematology care.  ?If you have a lab appointment with the [place], please go directly to the [place] and check in at the registration area. ? ?Wear comfortable clothing and clothing appropriate for easy access to any Portacath or PICC line.  ? ?We strive to give you quality time with your provider. You may need to reschedule your appointment if you arrive late (15 or more minutes).  Arriving late affects you and other patients whose appointments are after yours.  Also, if you miss three or more appointments without notifying the office, you may be dismissed from the clinic at the provider?s discretion.    ?  ?For prescription refill requests, have your pharmacy contact our office and allow 72 hours for refills to be completed.   ? ?Today you received the following chemotherapy and/or immunotherapy agents ZIRABEV and ALIMTA    ?  ?To help prevent nausea and vomiting after your treatment, we encourage you to take your nausea medication as directed. ? ?BELOW ARE SYMPTOMS THAT SHOULD BE REPORTED IMMEDIATELY: ?*FEVER GREATER THAN 100.4 F (38 ?C) OR HIGHER ?*CHILLS OR SWEATING ?*NAUSEA AND VOMITING THAT IS NOT CONTROLLED WITH YOUR NAUSEA MEDICATION ?*UNUSUAL SHORTNESS OF BREATH ?*UNUSUAL BRUISING OR BLEEDING ?*URINARY PROBLEMS (pain or burning when urinating, or frequent urination) ?*BOWEL PROBLEMS (unusual diarrhea, constipation, pain near the anus) ?TENDERNESS IN MOUTH AND THROAT WITH OR WITHOUT PRESENCE OF ULCERS (sore throat, sores in mouth, or a toothache) ?UNUSUAL RASH, SWELLING OR PAIN  ?UNUSUAL VAGINAL DISCHARGE OR ITCHING  ? ?Items with * indicate a potential emergency and should be followed up as soon as possible or go to the Emergency Department if any problems should occur. ? ?Please show the CHEMOTHERAPY ALERT CARD or IMMUNOTHERAPY ALERT CARD at  check-in to the Emergency Department and triage nurse. ? ?Should you have questions after your visit or need to cancel or reschedule your appointment, please contact [facility] CANCER [place] AT [place]  [phone] and follow the prompts.  Office hours are 8:00 a.m. to 4:30 p.m. Monday - Friday. Please note that voicemails left after 4:00 p.m. may not be returned until the following business day.  We are closed weekends and major holidays. You have access to a nurse at all times for urgent questions. Please call the main number to the clinic [phone] and follow the prompts. ? ?For any non-urgent questions, you may also contact your provider using MyChart. We now offer e-Visits for anyone 21 and older to request care online for non-urgent symptoms. For details visit [email or web address]. ?  ?Also download the MyChart app! Go to the app store, search "MyChart", open the app, select [place], and log in with your MyChart username and password. ? ?Due to Covid, a mask is required upon entering the hospital/clinic. If you do not have a mask, one will be given to you upon arrival. For doctor visits, patients may have 1 support person aged 32 or older with them. For treatment visits, patients cannot have anyone with them due to current Covid guidelines and our immunocompromised population.  ? ?Pemetrexed injection ?What is this medication? ?PEMETREXED (PEM e TREX ed) is a chemotherapy drug used to treat lung cancers like non-small cell lung cancer and mesothelioma. It may also be used to treat other cancers. ?This medicine may be used for other purposes; ask your health care provider or pharmacist if you  have questions. ?COMMON BRAND NAME(S): Alimta, PEMFEXY ?What should I tell my care team before I take this medication? ?They need to know if you have any of these conditions: ?infection (especially a virus infection such as chickenpox, cold sores, or herpes) ?kidney disease ?low blood counts, like low  white cell, platelet, or red cell counts ?lung or breathing disease, like asthma ?radiation therapy ?an unusual or allergic reaction to pemetrexed, other medicines, foods, dyes, or preservative ?pregnant or trying to get pregnant ?breast-feeding ?How should I use this medication? ?This drug is given as an infusion into a vein. It is administered in a hospital or clinic by a specially trained health care professional. ?Talk to your pediatrician regarding the use of this medicine in children. Special care may be needed. ?Overdosage: If you think you have taken too much of this medicine contact a poison control center or emergency room at once. ?NOTE: This medicine is only for you. Do not share this medicine with others. ?What if I miss a dose? ?It is important not to miss your dose. Call your doctor or health care professional if you are unable to keep an appointment. ?What may interact with this medication? ?This medicine may interact with the following medications: ?Ibuprofen ?This list may not describe all possible interactions. Give your health care provider a list of all the medicines, herbs, non-prescription drugs, or dietary supplements you use. Also tell them if you smoke, drink alcohol, or use illegal drugs. Some items may interact with your medicine. ?What should I watch for while using this medication? ?Visit your doctor for checks on your progress. This drug may make you feel generally unwell. This is not uncommon, as chemotherapy can affect healthy cells as well as cancer cells. Report any side effects. Continue your course of treatment even though you feel ill unless your doctor tells you to stop. ?In some cases, you may be given additional medicines to help with side effects. Follow all directions for their use. ?Call your doctor or health care professional for advice if you get a fever, chills or sore throat, or other symptoms of a cold or flu. Do not treat yourself. This drug decreases your body's  ability to fight infections. Try to avoid being around people who are sick. ?This medicine may increase your risk to bruise or bleed. Call your doctor or health care professional if you notice any unusual bleeding. ?Be careful brushing and flossing your teeth or using a toothpick because you may get an infection or bleed more easily. If you have any dental work done, tell your dentist you are receiving this medicine. ?Avoid taking products that contain aspirin, acetaminophen, ibuprofen, naproxen, or ketoprofen unless instructed by your doctor. These medicines may hide a fever. ?Call your doctor or health care professional if you get diarrhea or mouth sores. Do not treat yourself. ?To protect your kidneys, drink water or other fluids as directed while you are taking this medicine. ?Do not become pregnant while taking this medicine or for 6 months after stopping it. Women should inform their doctor if they wish to become pregnant or think they might be pregnant. Men should not father a child while taking this medicine and for 3 months after stopping it. This may interfere with the ability to father a child. You should talk to your doctor or health care professional if you are concerned about your fertility. There is a potential for serious side effects to an unborn child. Talk to your health care professional or  pharmacist for more information. Do not breast-feed an infant while taking this medicine or for 1 week after stopping it. ?What side effects may I notice from receiving this medication? ?Side effects that you should report to your doctor or health care professional as soon as possible: ?allergic reactions like skin rash, itching or hives, swelling of the face, lips, or tongue ?breathing problems ?redness, blistering, peeling or loosening of the skin, including inside the mouth ?signs and symptoms of bleeding such as bloody or black, tarry stools; red or dark-brown urine; spitting up blood or brown material  that looks like coffee grounds; red spots on the skin; unusual bruising or bleeding from the eye, gums, or nose ?signs and symptoms of infection like fever or chills; cough; sore throat; pain or trouble passing
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
## 2021-12-02 ENCOUNTER — Inpatient Hospital Stay (HOSPITAL_BASED_OUTPATIENT_CLINIC_OR_DEPARTMENT_OTHER): Payer: Medicaid Other | Admitting: Hospice and Palliative Medicine

## 2021-12-02 DIAGNOSIS — C3412 Malignant neoplasm of upper lobe, left bronchus or lung: Secondary | ICD-10-CM

## 2021-12-02 NOTE — Progress Notes (Signed)
Virtual Visit via Telephone Note ? ?I connected with Ryan Cannon on 12/02/21 at 10:30 AM EDT by telephone and verified that I am speaking with the correct person using two identifiers. ? ?Location: ?Patient: Home ?Provider: Clinic ?  ?I discussed the limitations, risks, security and privacy concerns of performing an evaluation and management service by telephone and the availability of in person appointments. I also discussed with the patient that there may be a patient responsible charge related to this service. The patient expressed understanding and agreed to proceed. ? ? ?History of Present Illness: ?Ryan Cannon is a 56 y.o. male with multiple medical problems including multiple sclerosis and recently found to have probable stage IV lung cancer with metastasis to scapula and possible liver.  Patient has a lytic destructive lesion involving the inferior angle of the right scapula.  Patient has had severe pain and was referred to palliative care to help address goals and manage ongoing symptoms.  ?  ?Observations/Objective: ?I called and spoke with patient by phone.  He reports that he is doing reasonably well.  Pain is fairly stable on current regimen of fentanyl and oxycodone.  Patient denies adverse effects from pain medications.  He reports stable performance status and fair appetite.  No significant changes or concerns today. ? ?Assessment and Plan: ?Stage IV lung cancer -on systemic chemotherapy.  Followed by Dr. Rogue Bussing ? ?Neoplasm related pain -continue fentanyl/oxycodone.  Both were recently refilled. ? ?Follow Up Instructions: ?Follow-up telephone visit 1 month ?  ?I discussed the assessment and treatment plan with the patient. The patient was provided an opportunity to ask questions and all were answered. The patient agreed with the plan and demonstrated an understanding of the instructions. ?  ?The patient was advised to call back or seek an in-person evaluation if the symptoms worsen or if  the condition fails to improve as anticipated. ? ?I provided 5 minutes of non-face-to-face time during this encounter. ? ? ?Irean Hong, NP ? ? ?

## 2021-12-10 ENCOUNTER — Telehealth: Payer: Self-pay | Admitting: Dietician

## 2021-12-10 NOTE — Telephone Encounter (Signed)
Nutrition Assessment   Reason for Assessment: MST screen for weight loss.    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 Rogue Bussing.  He has PMHx that includes HTN, lower back pain, chronic knee pain, and MS.  He report he has good appetite, but know that his portions getting smaller.  Losing interest in eating as some foods are tasting "weird, can't describe." Can't eat a cheese steak any more but chicken seems OK.  He denies  nausea, or other nutrition impact symptoms except  constipation which he has with chemo for about 2 days.  Usual PO:  Bacon & eggs Sandwich (cheese with hot sauce) and chips Frozen dinners but only eats one (Usually Hungry man) Fluids:  [place] 4 bottles, lot of water  Likes fruits, milk, milkshakes, cheese and crackers, peanut butter   Anthropometrics:  Weight loss of 12.25# (7%) past 60 days  Height: 69" Weight:  11/29/21   160.75# 11/08/21    164.75# 09/27/21  173# UBW: 180-190# BMI: 23.75  INTERVENTION: Encouraged weight maintenance or slow gain 2-4# per month. He's willing to trial snacking between meals. Mailed high protein/high calorie snack ideas with contact information.  Encouraged him to ask for ONS samples when at infusion.   MONITORING, EVALUATION, GOAL: Weight trends and PO intake   Next Visit: by telephone 12/26/21  April Manson, RDN, LDN Registered Dietitian, [place] Part Time Remote (Usual office hours: Tuesday-Thursday) Cell: [phone]

## 2021-12-12 ENCOUNTER — Other Ambulatory Visit: Payer: Self-pay | Admitting: *Deleted

## 2021-12-12 MED ORDER — OXYCODONE HCL 10 MG PO TABS: 10.0000 mg | ORAL_TABLET | Freq: Three times a day (TID) | ORAL | 0 refills | Status: DC | PRN

## 2021-12-12 MED ORDER — FENTANYL 50 MCG/HR TD PT72: 1.0000 | MEDICATED_PATCH | TRANSDERMAL | 0 refills | Status: DC

## 2021-12-12 NOTE — Telephone Encounter (Signed)
Pt requests refill of fentanyl patches and oxycodone to be sent into [facility].

## 2021-12-13 ENCOUNTER — Ambulatory Visit: Admission: RE | Admit: 2021-12-13 | Payer: Medicaid Other | Source: Ambulatory Visit

## 2021-12-17 ENCOUNTER — Telehealth: Payer: Self-pay

## 2021-12-17 NOTE — Telephone Encounter (Signed)
PA for Fentanyl 50 mcg/HR patch submitted via cover my  meds ([id number])

## 2021-12-17 NOTE — Telephone Encounter (Signed)
PA approved, notification faxed to [facility].

## 2021-12-20 ENCOUNTER — Inpatient Hospital Stay: Payer: Medicaid Other

## 2021-12-20 ENCOUNTER — Inpatient Hospital Stay (HOSPITAL_BASED_OUTPATIENT_CLINIC_OR_DEPARTMENT_OTHER): Payer: Medicaid Other | Admitting: Internal Medicine

## 2021-12-20 ENCOUNTER — Inpatient Hospital Stay: Payer: Medicaid Other | Attending: Internal Medicine

## 2021-12-20 DIAGNOSIS — C3412 Malignant neoplasm of upper lobe, left bronchus or lung: Secondary | ICD-10-CM | POA: Insufficient documentation

## 2021-12-20 DIAGNOSIS — C7951 Secondary malignant neoplasm of bone: Secondary | ICD-10-CM | POA: Insufficient documentation

## 2021-12-20 DIAGNOSIS — Z5112 Encounter for antineoplastic immunotherapy: Secondary | ICD-10-CM | POA: Diagnosis present

## 2021-12-20 DIAGNOSIS — C3490 Malignant neoplasm of unspecified part of unspecified bronchus or lung: Secondary | ICD-10-CM

## 2021-12-20 DIAGNOSIS — Z79899 Other long term (current) drug therapy: Secondary | ICD-10-CM | POA: Insufficient documentation

## 2021-12-20 DIAGNOSIS — Z5111 Encounter for antineoplastic chemotherapy: Secondary | ICD-10-CM | POA: Insufficient documentation

## 2021-12-20 LAB — CBC WITH DIFFERENTIAL/PLATELET
Abs Immature Granulocytes: 0.06 10*3/uL (ref 0.00–0.07)
Basophils Absolute: 0 10*3/uL (ref 0.0–0.1)
Basophils Relative: 0 %
Eosinophils Absolute: 0.1 10*3/uL (ref 0.0–0.5)
Eosinophils Relative: 1 %
HCT: 39 % (ref 39.0–52.0)
Hemoglobin: 12.4 g/dL — ABNORMAL LOW (ref 13.0–17.0)
Immature Granulocytes: 1 %
Lymphocytes Relative: 12 %
Lymphs Abs: 1.4 10*3/uL (ref 0.7–4.0)
MCH: 32.4 pg (ref 26.0–34.0)
MCHC: 31.8 g/dL (ref 30.0–36.0)
MCV: 101.8 fL — ABNORMAL HIGH (ref 80.0–100.0)
Monocytes Absolute: 1.2 10*3/uL — ABNORMAL HIGH (ref 0.1–1.0)
Monocytes Relative: 10 %
Neutro Abs: 8.8 10*3/uL — ABNORMAL HIGH (ref 1.7–7.7)
Neutrophils Relative %: 76 %
Platelets: 415 10*3/uL — ABNORMAL HIGH (ref 150–400)
RBC: 3.83 MIL/uL — ABNORMAL LOW (ref 4.22–5.81)
RDW: 20.5 % — ABNORMAL HIGH (ref 11.5–15.5)
WBC: 11.6 10*3/uL — ABNORMAL HIGH (ref 4.0–10.5)
nRBC: 0 % (ref 0.0–0.2)

## 2021-12-20 LAB — COMPREHENSIVE METABOLIC PANEL
ALT: 11 U/L (ref 0–44)
AST: 18 U/L (ref 15–41)
Albumin: 3.5 g/dL (ref 3.5–5.0)
Alkaline Phosphatase: 62 U/L (ref 38–126)
Anion gap: 7 (ref 5–15)
BUN: 12 mg/dL (ref 6–20)
CO2: 27 mmol/L (ref 22–32)
Calcium: 9.2 mg/dL (ref 8.9–10.3)
Chloride: 103 mmol/L (ref 98–111)
Creatinine, Ser: 0.93 mg/dL (ref 0.61–1.24)
GFR, Estimated: >60 mL/min (ref >60)
Glucose, Bld: 94 mg/dL (ref 70–99)
Potassium: 4.3 mmol/L (ref 3.5–5.1)
Sodium: 137 mmol/L (ref 135–145)
Total Bilirubin: <0.1 mg/dL — ABNORMAL LOW (ref 0.3–1.2)
Total Protein: 8.8 g/dL — ABNORMAL HIGH (ref 6.5–8.1)

## 2021-12-20 MED ORDER — DEXAMETHASONE SODIUM PHOSPHATE 10 MG/ML IJ SOLN
4.0000 mg | Freq: Once | INTRAMUSCULAR | Status: AC
  Administered 2021-12-20: 4 mg via INTRAVENOUS
  Filled 2021-12-20: qty 1

## 2021-12-20 MED ORDER — SODIUM CHLORIDE 0.9 % IV SOLN
Freq: Once | INTRAVENOUS | Status: AC
  Filled 2021-12-20: qty 250

## 2021-12-20 MED ORDER — ACETAMINOPHEN 325 MG PO TABS
650.0000 mg | ORAL_TABLET | Freq: Once | ORAL | Status: AC
  Administered 2021-12-20: 650 mg via ORAL
  Filled 2021-12-20: qty 2

## 2021-12-20 MED ORDER — SODIUM CHLORIDE 0.9 % IV SOLN
15.0000 mg/kg | Freq: Once | INTRAVENOUS | Status: AC
  Administered 2021-12-20: 1200 mg via INTRAVENOUS
  Filled 2021-12-20: qty 48

## 2021-12-20 MED ORDER — SODIUM CHLORIDE 0.9 % IV SOLN
500.0000 mg/m2 | Freq: Once | INTRAVENOUS | Status: AC
  Administered 2021-12-20: 1000 mg via INTRAVENOUS
  Filled 2021-12-20: qty 40

## 2021-12-20 NOTE — Patient Instructions (Signed)
#  Take Benadryl 1 pill 25 mg every 6-8 hours-when you have the fever/chills from chemotherapy.

## 2021-12-20 NOTE — Progress Notes (Signed)
Pt states he has started taking all of his prescription meds on a regular basis and is needing some refills.  Pt also has some questions regarding treatment cycles.

## 2021-12-20 NOTE — Progress Notes (Signed)
No urine collected with labs today. Per Dr. Rogue Bussing, okay to proceed with treatment. 11/29/21 UA Protein Negative.
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
## 2021-12-20 NOTE — Assessment & Plan Note (Addendum)
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 CI to immunotherapy]. S/p carbo-alimta- CT scan April 28th, 2023-  Interval decrease in size of the right middle lobe and left upper lobe pulmonary lesions. No new or progressive findings. Interval decrease in size of the right hilar node also; Stable 15 mm right hepatic dome lesion.  No new hepatic lesions; Stable advanced destructive changes involving the right scapula. No new or progressive bony findings  # Proceed with chemo-maintenance Alimta Avastin- cycle #2. Labs today reviewed;  acceptable for treatment today.  Discussed with the patient that the treatments are indefinite.   # Chills- random; no fevers-? Sec to chemo; no infection.  recommend Tyelnol/benadryl prn.   Stable.   # HTN--on amlodipine ;-proceed with Avastin today.  Stable.  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 MRI brain.-Not done sec to transport issues.    # multiple sclerosis-on Avonex  SQ. discussed with Dr. Manuella Ghazi; neurology-states multiple sclerosis-   STABLE.   # Pain right scapula-second toe metastatic malignancy - started on RT [Jan 30th-Feb, 13th]- on oxycodone 10- mg  Up to 4 times a day; on fenatnyl Ptach 5mcg;  Consider zometa [needs dental clearance]. STABLE.  # Smoking: cutting down quit smoking- intermittently.   # IV access: Declined port placement.  *pt cant drive/ uber  * F07 w3 cycles- 6-23 ; MRI Brain  Prior  # DISPOSITION:  # chemo today;   #in 3 weeks- MD: labs- cbc/cmp;Alitma-Avastin; UA;B12 injection - Dr.B

## 2021-12-20 NOTE — Patient Instructions (Signed)
[facility] [place]  Discharge Instructions: Thank you for choosing [facility] to provide your oncology and hematology care.  If you have a lab appointment with the [facility], please go directly to the [facility] and check in at the registration area.  Wear comfortable clothing and clothing appropriate for easy access to any Portacath or PICC line.   We strive to give you quality time with your provider. You may need to reschedule your appointment if you arrive late (15 or more minutes).  Arriving late affects you and other patients whose appointments are after yours.  Also, if you miss three or more appointments without notifying the office, you may be dismissed from the clinic at the provider's discretion.      For prescription refill requests, have your pharmacy contact our office and allow 72 hours for refills to be completed.    Today you received the following chemotherapy and/or immunotherapy agents Alimta & Avastin      To help prevent nausea and vomiting after your treatment, we encourage you to take your nausea medication as directed.  BELOW ARE SYMPTOMS THAT SHOULD BE REPORTED IMMEDIATELY: *FEVER GREATER THAN 100.4 F (38 C) OR HIGHER *CHILLS OR SWEATING *NAUSEA AND VOMITING THAT IS NOT CONTROLLED WITH YOUR NAUSEA MEDICATION *UNUSUAL SHORTNESS OF BREATH *UNUSUAL BRUISING OR BLEEDING *URINARY PROBLEMS (pain or burning when urinating, or frequent urination) *BOWEL PROBLEMS (unusual diarrhea, constipation, pain near the anus) TENDERNESS IN MOUTH AND THROAT WITH OR WITHOUT PRESENCE OF ULCERS (sore throat, sores in mouth, or a toothache) UNUSUAL RASH, SWELLING OR PAIN  UNUSUAL VAGINAL DISCHARGE OR ITCHING   Items with * indicate a potential emergency and should be followed up as soon as possible or go to the Emergency Department if any problems should occur.  Please show the CHEMOTHERAPY ALERT CARD or IMMUNOTHERAPY ALERT CARD at  check-in to the Emergency Department and triage nurse.  Should you have questions after your visit or need to cancel or reschedule your appointment, please contact [facility] [place] [facility] [place]  [phone] and follow the prompts.  Office hours are 8:00 a.m. to 4:30 p.m. Monday - Friday. Please note that voicemails left after 4:00 p.m. may not be returned until the following business day.  We are closed weekends and major holidays. You have access to a nurse at all times for urgent questions. Please call the main number to the clinic [phone] and follow the prompts.  For any non-urgent questions, you may also contact your provider using MyChart. We now offer e-Visits for anyone 5 and older to request care online for non-urgent symptoms. For details visit [email or web address].   Also download the MyChart app! Go to the app store, search "MyChart", open the app, select [facility], and log in with your MyChart username and password.  Due to Covid, a mask is required upon entering the hospital/clinic. If you do not have a mask, one will be given to you upon arrival. For doctor visits, patients may have 1 support person aged 33 or older with them. For treatment visits, patients cannot have anyone with them due to current Covid guidelines and our immunocompromised population.

## 2021-12-26 ENCOUNTER — Inpatient Hospital Stay: Payer: Medicaid Other | Admitting: Dietician

## 2021-12-26 ENCOUNTER — Ambulatory Visit: Payer: Medicaid Other

## 2021-12-26 NOTE — Progress Notes (Signed)
Called patient's home telephone number for a scheduled follow up on his weight and any changes to his PO intake since last chat.  Left message on voice mail.  April Manson, RDN, LDN Registered Dietitian, [facility] Part Time Remote (Usual office hours: Tuesday-Thursday) Remote Office: [phone]

## 2022-01-02 ENCOUNTER — Inpatient Hospital Stay (HOSPITAL_BASED_OUTPATIENT_CLINIC_OR_DEPARTMENT_OTHER): Payer: Medicaid Other | Admitting: Hospice and Palliative Medicine

## 2022-01-02 DIAGNOSIS — C3412 Malignant neoplasm of upper lobe, left bronchus or lung: Secondary | ICD-10-CM | POA: Diagnosis not present

## 2022-01-02 NOTE — Progress Notes (Signed)
Virtual Visit via Telephone Note  I connected with Ryan Cannon on 01/02/22 at 10:30 AM EDT by telephone and verified that I am speaking with the correct person using two identifiers.  Location: Patient: Home Provider: Clinic   I discussed the limitations, risks, security and privacy concerns of performing an evaluation and management service by telephone and the availability of in person appointments. I also discussed with the patient that there may be a patient responsible charge related to this service. The patient expressed understanding and agreed to proceed.   History of Present Illness: Ryan Cannon is a 56 y.o. male with multiple medical problems including multiple sclerosis and recently found to have probable stage IV lung cancer with metastasis to scapula and possible liver.  Patient has a lytic destructive lesion involving the inferior angle of the right scapula.  Patient has had severe pain and was referred to palliative care to help address goals and manage ongoing symptoms.    Observations/Objective: I called and spoke with patient by phone.    He reports poor pain control.  He does not feel like the oxycodone are helping as they once did.  He says he is taking one of the oxycodone about every 5 hours around-the-clock.  However, he says that he had stopped the transdermal fentanyl but restarted that a couple days ago.  I encouraged him to see if pain becomes better controlled after restarting the transdermal fentanyl.  Additionally, patient is pending evaluation early next week by Dr. Dossie Arbour.  Patient requested me in transportation to the pain clinic.  Discussed with schedulers.  Assessment and Plan: Stage IV lung cancer -on systemic chemotherapy.  Followed by Dr. Rogue Bussing  Neoplasm related pain -continue fentanyl/oxycodone.  Both were recently refilled.  Patient is pending evaluation by pain clinic next week  Follow Up Instructions: Follow-up telephone visit 1  month   I discussed the assessment and treatment plan with the patient. The patient was provided an opportunity to ask questions and all were answered. The patient agreed with the plan and demonstrated an understanding of the instructions.   The patient was advised to call back or seek an in-person evaluation if the symptoms worsen or if the condition fails to improve as anticipated.  I provided 5 minutes of non-face-to-face time during this encounter.   Irean Hong, NP

## 2022-01-05 DIAGNOSIS — Z789 Other specified health status: Secondary | ICD-10-CM | POA: Insufficient documentation

## 2022-01-05 DIAGNOSIS — Z79899 Other long term (current) drug therapy: Secondary | ICD-10-CM | POA: Insufficient documentation

## 2022-01-05 DIAGNOSIS — M899 Disorder of bone, unspecified: Secondary | ICD-10-CM | POA: Insufficient documentation
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
## 2022-01-06 ENCOUNTER — Encounter: Payer: Self-pay | Admitting: Pain Medicine

## 2022-01-06 ENCOUNTER — Inpatient Hospital Stay: Payer: Medicaid Other

## 2022-01-06 ENCOUNTER — Other Ambulatory Visit: Payer: Self-pay | Admitting: Internal Medicine

## 2022-01-06 ENCOUNTER — Ambulatory Visit (HOSPITAL_BASED_OUTPATIENT_CLINIC_OR_DEPARTMENT_OTHER): Payer: Medicaid Other | Admitting: Pain Medicine

## 2022-01-06 ENCOUNTER — Ambulatory Visit
Admission: RE | Admit: 2022-01-06 | Discharge: 2022-01-06 | Disposition: A | Discharge status: Home / Self Care | Payer: Medicaid Other | Source: Ambulatory Visit | Attending: Internal Medicine | Admitting: Internal Medicine

## 2022-01-06 VITALS — BP 135/74 | HR 84 | Temp 97.9°F | Resp 16 | Ht 69.0 in | Wt 161.2 lb

## 2022-01-06 DIAGNOSIS — C7931 Secondary malignant neoplasm of brain: Secondary | ICD-10-CM | POA: Diagnosis present

## 2022-01-06 DIAGNOSIS — C3412 Malignant neoplasm of upper lobe, left bronchus or lung: Secondary | ICD-10-CM

## 2022-01-06 DIAGNOSIS — Z79899 Other long term (current) drug therapy: Secondary | ICD-10-CM

## 2022-01-06 DIAGNOSIS — C3490 Malignant neoplasm of unspecified part of unspecified bronchus or lung: Secondary | ICD-10-CM | POA: Insufficient documentation

## 2022-01-06 DIAGNOSIS — M899 Disorder of bone, unspecified: Secondary | ICD-10-CM

## 2022-01-06 DIAGNOSIS — G894 Chronic pain syndrome: Secondary | ICD-10-CM | POA: Insufficient documentation

## 2022-01-06 DIAGNOSIS — Z789 Other specified health status: Secondary | ICD-10-CM

## 2022-01-06 DIAGNOSIS — R937 Abnormal findings on diagnostic imaging of other parts of musculoskeletal system: Secondary | ICD-10-CM | POA: Insufficient documentation

## 2022-01-06 MED ORDER — GADOBUTROL 1 MMOL/ML IV SOLN: 7.0000 mL | Freq: Once | INTRAVENOUS | Status: DC | PRN

## 2022-01-06 NOTE — Progress Notes (Signed)
Safety precautions to be maintained throughout the outpatient stay will include: orient to surroundings, keep bed in low position, maintain call bell within reach at all times, provide assistance with transfer out of bed and ambulation.   Upon rooming pt, Mr. Quintanar repeatedly asked why I was asking so many questions about his condition and pain, "Isn't all that in my chart? I don't understand why I have to answer all these questions if it's all in my chart. I just want something for my pain. The pills they gave me aren't helping anymore."  When I asked pt. if he had completed the new patient survey, he said, "no, I filled out enough paperwork, I'm not filling that out."  When I gave report to Dr. Dossie Arbour, Dr Dossie Arbour asked me to inform pt. that per clinic policy, no medications will be presribed on the first visit. When I did so, the pt. asked, "so I can't get any pain medicine today?" I confirmed that Dr. Consuela Mimes does not prescribe medicine on one's first visit with the pain clinic. Pt. Immediately got up and said, "well then I'm leaving". And he did.

## 2022-01-07 ENCOUNTER — Encounter: Payer: Self-pay | Admitting: Internal Medicine

## 2022-01-09 ENCOUNTER — Other Ambulatory Visit: Payer: Self-pay

## 2022-01-09 ENCOUNTER — Telehealth: Payer: Self-pay | Admitting: *Deleted

## 2022-01-09 ENCOUNTER — Other Ambulatory Visit: Payer: Self-pay | Admitting: *Deleted

## 2022-01-09 DIAGNOSIS — C3412 Malignant neoplasm of upper lobe, left bronchus or lung: Secondary | ICD-10-CM

## 2022-01-09 DIAGNOSIS — G893 Neoplasm related pain (acute) (chronic): Secondary | ICD-10-CM

## 2022-01-09 MED ORDER — FENTANYL 50 MCG/HR TD PT72: 1.0000 | MEDICATED_PATCH | TRANSDERMAL | 0 refills | Status: DC

## 2022-01-09 MED ORDER — OXYCODONE HCL 10 MG PO TABS: 10.0000 mg | ORAL_TABLET | Freq: Three times a day (TID) | ORAL | 0 refills | Status: DC | PRN

## 2022-01-09 NOTE — Telephone Encounter (Signed)
I spoke with Ryan Cannon at the [facility], the 25 is on back  order as well and don't know when they will get any. They have 9 in stock. I will pend the rx if you want to send in.

## 2022-01-09 NOTE — Telephone Encounter (Addendum)
Pharmacy called reporting that Fentanyl 50 mcg is on back order and not sure when they will be able to get any. Please advise of new orders  [facility] does not have any either

## 2022-01-10 ENCOUNTER — Inpatient Hospital Stay: Payer: Medicaid Other

## 2022-01-10 ENCOUNTER — Inpatient Hospital Stay (HOSPITAL_BASED_OUTPATIENT_CLINIC_OR_DEPARTMENT_OTHER): Payer: Medicaid Other | Admitting: Internal Medicine

## 2022-01-10 ENCOUNTER — Encounter: Payer: Self-pay | Admitting: Internal Medicine

## 2022-01-10 ENCOUNTER — Other Ambulatory Visit: Payer: Self-pay

## 2022-01-10 VITALS — BP 134/89 | HR 83 | Temp 97.6°F | Ht 69.0 in | Wt 168.0 lb

## 2022-01-10 DIAGNOSIS — C7931 Secondary malignant neoplasm of brain: Secondary | ICD-10-CM | POA: Diagnosis not present

## 2022-01-10 DIAGNOSIS — C3490 Malignant neoplasm of unspecified part of unspecified bronchus or lung: Secondary | ICD-10-CM

## 2022-01-10 DIAGNOSIS — C3412 Malignant neoplasm of upper lobe, left bronchus or lung: Secondary | ICD-10-CM | POA: Diagnosis not present

## 2022-01-10 DIAGNOSIS — Z5111 Encounter for antineoplastic chemotherapy: Secondary | ICD-10-CM | POA: Diagnosis not present

## 2022-01-10 LAB — CBC WITH DIFFERENTIAL/PLATELET
Abs Immature Granulocytes: 0.08 10*3/uL — ABNORMAL HIGH (ref 0.00–0.07)
Basophils Absolute: 0.1 10*3/uL (ref 0.0–0.1)
Basophils Relative: 0 %
Eosinophils Absolute: 0.2 10*3/uL (ref 0.0–0.5)
Eosinophils Relative: 2 %
HCT: 39.1 % (ref 39.0–52.0)
Hemoglobin: 12.5 g/dL — ABNORMAL LOW (ref 13.0–17.0)
Immature Granulocytes: 1 %
Lymphocytes Relative: 13 %
Lymphs Abs: 1.6 10*3/uL (ref 0.7–4.0)
MCH: 33.5 pg (ref 26.0–34.0)
MCHC: 32 g/dL (ref 30.0–36.0)
MCV: 104.8 fL — ABNORMAL HIGH (ref 80.0–100.0)
Monocytes Absolute: 1 10*3/uL (ref 0.1–1.0)
Monocytes Relative: 8 %
Neutro Abs: 9.4 10*3/uL — ABNORMAL HIGH (ref 1.7–7.7)
Neutrophils Relative %: 76 %
Platelets: 402 10*3/uL — ABNORMAL HIGH (ref 150–400)
RBC: 3.73 MIL/uL — ABNORMAL LOW (ref 4.22–5.81)
RDW: 17.2 % — ABNORMAL HIGH (ref 11.5–15.5)
WBC: 12.4 10*3/uL — ABNORMAL HIGH (ref 4.0–10.5)
nRBC: 0 % (ref 0.0–0.2)

## 2022-01-10 LAB — COMPREHENSIVE METABOLIC PANEL
ALT: 10 U/L (ref 0–44)
AST: 20 U/L (ref 15–41)
Albumin: 3.6 g/dL (ref 3.5–5.0)
Alkaline Phosphatase: 59 U/L (ref 38–126)
Anion gap: 11 (ref 5–15)
BUN: 12 mg/dL (ref 6–20)
CO2: 25 mmol/L (ref 22–32)
Calcium: 9.2 mg/dL (ref 8.9–10.3)
Chloride: 100 mmol/L (ref 98–111)
Creatinine, Ser: 1.02 mg/dL (ref 0.61–1.24)
GFR, Estimated: >60 mL/min (ref >60)
Glucose, Bld: 117 mg/dL — ABNORMAL HIGH (ref 70–99)
Potassium: 4.1 mmol/L (ref 3.5–5.1)
Sodium: 136 mmol/L (ref 135–145)
Total Bilirubin: 0.3 mg/dL (ref 0.3–1.2)
Total Protein: 9 g/dL — ABNORMAL HIGH (ref 6.5–8.1)

## 2022-01-10 LAB — URINALYSIS, COMPLETE (UACMP) WITH MICROSCOPIC
Bacteria, UA: NONE SEEN
Glucose, UA: NEGATIVE mg/dL
Hgb urine dipstick: NEGATIVE
Ketones, ur: NEGATIVE mg/dL
Leukocytes,Ua: NEGATIVE
Nitrite: NEGATIVE
Protein, ur: NEGATIVE mg/dL
Specific Gravity, Urine: 1.011 (ref 1.005–1.030)
pH: 5 (ref 5.0–8.0)

## 2022-01-10 LAB — TSH: TSH: 7.763 u[IU]/mL — ABNORMAL HIGH (ref 0.350–4.500)

## 2022-01-10 MED ORDER — CYANOCOBALAMIN 1000 MCG/ML IJ SOLN
1000.0000 ug | Freq: Once | INTRAMUSCULAR | Status: AC
  Administered 2022-01-10: 1000 ug via INTRAMUSCULAR
  Filled 2022-01-10: qty 1

## 2022-01-10 MED ORDER — SODIUM CHLORIDE 0.9 % IV SOLN
Freq: Once | INTRAVENOUS | Status: AC
  Filled 2022-01-10: qty 250

## 2022-01-10 MED ORDER — ACETAMINOPHEN 325 MG PO TABS: 650.0000 mg | ORAL_TABLET | Freq: Once | ORAL | Status: DC

## 2022-01-10 MED ORDER — FENTANYL 25 MCG/HR TD PT72: 2.0000 | MEDICATED_PATCH | TRANSDERMAL | 0 refills | Status: AC

## 2022-01-10 MED ORDER — FOLIC ACID 1 MG PO TABS: 1.0000 mg | ORAL_TABLET | Freq: Every day | ORAL | 1 refills | Status: DC

## 2022-01-10 MED ORDER — SODIUM CHLORIDE 0.9 % IV SOLN
500.0000 mg/m2 | Freq: Once | INTRAVENOUS | Status: AC
  Administered 2022-01-10: 1000 mg via INTRAVENOUS
  Filled 2022-01-10: qty 40

## 2022-01-10 MED ORDER — SODIUM CHLORIDE 0.9 % IV SOLN
15.0000 mg/kg | Freq: Once | INTRAVENOUS | Status: AC
  Administered 2022-01-10: 1200 mg via INTRAVENOUS
  Filled 2022-01-10: qty 48

## 2022-01-10 MED ORDER — DEXAMETHASONE SODIUM PHOSPHATE 10 MG/ML IJ SOLN
4.0000 mg | Freq: Once | INTRAMUSCULAR | Status: AC
  Administered 2022-01-10: 4 mg via INTRAVENOUS
  Filled 2022-01-10: qty 1

## 2022-01-10 NOTE — Telephone Encounter (Signed)
Done

## 2022-01-13 ENCOUNTER — Telehealth: Payer: Self-pay

## 2022-01-13 ENCOUNTER — Encounter: Payer: Self-pay | Admitting: Internal Medicine

## 2022-01-13 ENCOUNTER — Other Ambulatory Visit: Payer: Self-pay

## 2022-01-13 DIAGNOSIS — C3412 Malignant neoplasm of upper lobe, left bronchus or lung: Secondary | ICD-10-CM

## 2022-01-22 ENCOUNTER — Telehealth: Payer: Self-pay | Admitting: *Deleted

## 2022-01-22 ENCOUNTER — Other Ambulatory Visit: Payer: Self-pay | Admitting: *Deleted

## 2022-01-22 DIAGNOSIS — L989 Disorder of the skin and subcutaneous tissue, unspecified: Secondary | ICD-10-CM

## 2022-01-22 MED ORDER — OXYCODONE HCL 10 MG PO TABS: 10.0000 mg | ORAL_TABLET | Freq: Four times a day (QID) | ORAL | 0 refills | Status: DC | PRN

## 2022-01-22 NOTE — Telephone Encounter (Signed)
Pt called in to report that has developed painful skin lesions that prevent him from sitting comfortably. States has had these lesions before and had to be drained by dermatologist. Pt requested referral to dermatology and stated that will most likely go to urgent care/ED for possible drainage of skin lesions that he believes are infected. Offered for pt to be seen in [facility] but pt declined. Refill for oxycodone sent into pharmacy as well to help manage pain while awaiting further evaluation.

## 2022-01-22 NOTE — Telephone Encounter (Signed)
Referral placed by [facility].

## 2022-01-23 ENCOUNTER — Institutional Professional Consult (permissible substitution): Payer: Medicaid Other | Admitting: Pulmonary Disease

## 2022-01-29 ENCOUNTER — Ambulatory Visit: Payer: Medicaid Other | Admitting: Dermatology

## 2022-01-31 ENCOUNTER — Inpatient Hospital Stay: Payer: Medicaid Other | Attending: Internal Medicine

## 2022-01-31 ENCOUNTER — Encounter: Payer: Self-pay | Admitting: Internal Medicine

## 2022-01-31 ENCOUNTER — Inpatient Hospital Stay: Payer: Medicaid Other

## 2022-01-31 ENCOUNTER — Inpatient Hospital Stay (HOSPITAL_BASED_OUTPATIENT_CLINIC_OR_DEPARTMENT_OTHER): Payer: Medicaid Other | Admitting: Internal Medicine

## 2022-01-31 DIAGNOSIS — C3412 Malignant neoplasm of upper lobe, left bronchus or lung: Secondary | ICD-10-CM | POA: Diagnosis present

## 2022-01-31 DIAGNOSIS — Z5112 Encounter for antineoplastic immunotherapy: Secondary | ICD-10-CM | POA: Insufficient documentation

## 2022-01-31 DIAGNOSIS — Z5111 Encounter for antineoplastic chemotherapy: Secondary | ICD-10-CM | POA: Diagnosis present

## 2022-01-31 DIAGNOSIS — Z79899 Other long term (current) drug therapy: Secondary | ICD-10-CM | POA: Insufficient documentation

## 2022-01-31 DIAGNOSIS — C7951 Secondary malignant neoplasm of bone: Secondary | ICD-10-CM | POA: Insufficient documentation

## 2022-01-31 DIAGNOSIS — C787 Secondary malignant neoplasm of liver and intrahepatic bile duct: Secondary | ICD-10-CM | POA: Diagnosis not present

## 2022-01-31 LAB — COMPREHENSIVE METABOLIC PANEL
ALT: 11 U/L (ref 0–44)
AST: 21 U/L (ref 15–41)
Albumin: 3.3 g/dL — ABNORMAL LOW (ref 3.5–5.0)
Alkaline Phosphatase: 53 U/L (ref 38–126)
Anion gap: 9 (ref 5–15)
BUN: 10 mg/dL (ref 6–20)
CO2: 27 mmol/L (ref 22–32)
Calcium: 9.4 mg/dL (ref 8.9–10.3)
Chloride: 102 mmol/L (ref 98–111)
Creatinine, Ser: 0.9 mg/dL (ref 0.61–1.24)
GFR, Estimated: >60 mL/min (ref >60)
Glucose, Bld: 101 mg/dL — ABNORMAL HIGH (ref 70–99)
Potassium: 3.7 mmol/L (ref 3.5–5.1)
Sodium: 138 mmol/L (ref 135–145)
Total Bilirubin: 0.3 mg/dL (ref 0.3–1.2)
Total Protein: 8.5 g/dL — ABNORMAL HIGH (ref 6.5–8.1)

## 2022-01-31 LAB — CBC WITH DIFFERENTIAL/PLATELET
Abs Immature Granulocytes: 0.07 10*3/uL (ref 0.00–0.07)
Basophils Absolute: 0.1 10*3/uL (ref 0.0–0.1)
Basophils Relative: 1 %
Eosinophils Absolute: 0.1 10*3/uL (ref 0.0–0.5)
Eosinophils Relative: 1 %
HCT: 39.8 % (ref 39.0–52.0)
Hemoglobin: 12.5 g/dL — ABNORMAL LOW (ref 13.0–17.0)
Immature Granulocytes: 1 %
Lymphocytes Relative: 15 %
Lymphs Abs: 1.5 10*3/uL (ref 0.7–4.0)
MCH: 33.8 pg (ref 26.0–34.0)
MCHC: 31.4 g/dL (ref 30.0–36.0)
MCV: 107.6 fL — ABNORMAL HIGH (ref 80.0–100.0)
Monocytes Absolute: 1 10*3/uL (ref 0.1–1.0)
Monocytes Relative: 10 %
Neutro Abs: 7.5 10*3/uL (ref 1.7–7.7)
Neutrophils Relative %: 72 %
Platelets: 452 10*3/uL — ABNORMAL HIGH (ref 150–400)
RBC: 3.7 MIL/uL — ABNORMAL LOW (ref 4.22–5.81)
RDW: 15 % (ref 11.5–15.5)
WBC: 10.2 10*3/uL (ref 4.0–10.5)
nRBC: 0 % (ref 0.0–0.2)

## 2022-01-31 MED ORDER — SODIUM CHLORIDE 0.9 % IV SOLN
15.0000 mg/kg | Freq: Once | INTRAVENOUS | Status: AC
  Administered 2022-01-31: 1200 mg via INTRAVENOUS
  Filled 2022-01-31: qty 48

## 2022-01-31 MED ORDER — SODIUM CHLORIDE 0.9 % IV SOLN
Freq: Once | INTRAVENOUS | Status: AC
  Filled 2022-01-31: qty 250

## 2022-01-31 MED ORDER — DEXAMETHASONE SODIUM PHOSPHATE 10 MG/ML IJ SOLN
4.0000 mg | Freq: Once | INTRAMUSCULAR | Status: AC
  Administered 2022-01-31: 4 mg via INTRAVENOUS
  Filled 2022-01-31: qty 1

## 2022-01-31 MED ORDER — ACETAMINOPHEN 325 MG PO TABS
650.0000 mg | ORAL_TABLET | Freq: Once | ORAL | Status: AC
  Administered 2022-01-31: 650 mg via ORAL
  Filled 2022-01-31: qty 2

## 2022-01-31 MED ORDER — HEPARIN SOD (PORK) LOCK FLUSH 100 UNIT/ML IV SOLN
500.0000 [IU] | Freq: Once | INTRAVENOUS | Status: DC | PRN
  Filled 2022-01-31: qty 5

## 2022-01-31 MED ORDER — SODIUM CHLORIDE 0.9 % IV SOLN
500.0000 mg/m2 | Freq: Once | INTRAVENOUS | Status: AC
  Administered 2022-01-31: 1000 mg via INTRAVENOUS
  Filled 2022-01-31: qty 40

## 2022-01-31 NOTE — Patient Instructions (Signed)
[facility] [place]  Discharge Instructions: Thank you for choosing [facility] to provide your oncology and hematology care.  If you have a lab appointment with the [facility], please go directly to the [facility] and check in at the registration area.  Wear comfortable clothing and clothing appropriate for easy access to any Portacath or PICC line.   We strive to give you quality time with your provider. You may need to reschedule your appointment if you arrive late (15 or more minutes).  Arriving late affects you and other patients whose appointments are after yours.  Also, if you miss three or more appointments without notifying the office, you may be dismissed from the clinic at the provider's discretion.      For prescription refill requests, have your pharmacy contact our office and allow 72 hours for refills to be completed.    Today you received the following chemotherapy and/or immunotherapy agents Alimta & Noah Charon      To help prevent nausea and vomiting after your treatment, we encourage you to take your nausea medication as directed.  BELOW ARE SYMPTOMS THAT SHOULD BE REPORTED IMMEDIATELY: *FEVER GREATER THAN 100.4 F (38 C) OR HIGHER *CHILLS OR SWEATING *NAUSEA AND VOMITING THAT IS NOT CONTROLLED WITH YOUR NAUSEA MEDICATION *UNUSUAL SHORTNESS OF BREATH *UNUSUAL BRUISING OR BLEEDING *URINARY PROBLEMS (pain or burning when urinating, or frequent urination) *BOWEL PROBLEMS (unusual diarrhea, constipation, pain near the anus) TENDERNESS IN MOUTH AND THROAT WITH OR WITHOUT PRESENCE OF ULCERS (sore throat, sores in mouth, or a toothache) UNUSUAL RASH, SWELLING OR PAIN  UNUSUAL VAGINAL DISCHARGE OR ITCHING   Items with * indicate a potential emergency and should be followed up as soon as possible or go to the Emergency Department if any problems should occur.  Please show the CHEMOTHERAPY ALERT CARD or IMMUNOTHERAPY ALERT CARD at  check-in to the Emergency Department and triage nurse.  Should you have questions after your visit or need to cancel or reschedule your appointment, please contact [facility] [place] AT [place]  [phone] and follow the prompts.  Office hours are 8:00 a.m. to 4:30 p.m. Monday - Friday. Please note that voicemails left after 4:00 p.m. may not be returned until the following business day.  We are closed weekends and major holidays. You have access to a nurse at all times for urgent questions. Please call the main number to the clinic [phone] and follow the prompts.  For any non-urgent questions, you may also contact your provider using MyChart. We now offer e-Visits for anyone 41 and older to request care online for non-urgent symptoms. For details visit [email or web address].   Also download the MyChart app! Go to the app store, search "MyChart", open the app, select [employer], and log in with your MyChart username and password.  Masks are optional in the cancer centers. If you would like for your care team to wear a mask while they are taking care of you, please let them know. For doctor visits, patients may have with them one support person who is at least 58 years old. At this time, visitors are not allowed in the infusion area.

## 2022-01-31 NOTE — Progress Notes (Signed)
Pt here for follow up. No new concerns voiced.   
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
## 2022-01-31 NOTE — Progress Notes (Signed)
Ok to proceed without urine at this time, per Secure chat with Dr. Rogue Bussing.

## 2022-02-01 LAB — THYROID PANEL WITH TSH
Free Thyroxine Index: 1.9 (ref 1.2–4.9)
T3 Uptake Ratio: 23 % — ABNORMAL LOW (ref 24–39)
T4, Total: 8.4 ug/dL (ref 4.5–12.0)
TSH: 2.41 u[IU]/mL (ref 0.450–4.500)

## 2022-02-17 ENCOUNTER — Other Ambulatory Visit: Payer: Self-pay | Admitting: *Deleted

## 2022-02-17 ENCOUNTER — Inpatient Hospital Stay: Payer: Medicaid Other

## 2022-02-17 ENCOUNTER — Ambulatory Visit
Admission: RE | Admit: 2022-02-17 | Discharge: 2022-02-17 | Disposition: A | Discharge status: Home / Self Care | Payer: Medicaid Other | Source: Ambulatory Visit | Attending: Internal Medicine | Admitting: Internal Medicine

## 2022-02-17 DIAGNOSIS — C3412 Malignant neoplasm of upper lobe, left bronchus or lung: Secondary | ICD-10-CM | POA: Insufficient documentation

## 2022-02-17 MED ORDER — OXYCODONE HCL 10 MG PO TABS: 10.0000 mg | ORAL_TABLET | Freq: Four times a day (QID) | ORAL | 0 refills | Status: DC | PRN

## 2022-02-17 MED ORDER — FENTANYL 50 MCG/HR TD PT72: 1.0000 | MEDICATED_PATCH | TRANSDERMAL | 0 refills | Status: DC

## 2022-02-17 MED ORDER — IOHEXOL 300 MG/ML  SOLN
100.0000 mL | Freq: Once | INTRAMUSCULAR | Status: AC | PRN
  Administered 2022-02-17: 100 mL via INTRAVENOUS

## 2022-02-17 NOTE — Telephone Encounter (Signed)
Pt requesting refill of 40mcg fentanyl patch and oxycodone to be sent into [facility].

## 2022-02-19 ENCOUNTER — Inpatient Hospital Stay: Payer: Medicaid Other | Attending: Internal Medicine | Admitting: Hospice and Palliative Medicine

## 2022-02-19 ENCOUNTER — Telehealth: Payer: Self-pay | Admitting: *Deleted

## 2022-02-19 DIAGNOSIS — C3412 Malignant neoplasm of upper lobe, left bronchus or lung: Secondary | ICD-10-CM | POA: Diagnosis not present

## 2022-02-19 DIAGNOSIS — G893 Neoplasm related pain (acute) (chronic): Secondary | ICD-10-CM | POA: Diagnosis not present

## 2022-02-19 DIAGNOSIS — Z515 Encounter for palliative care: Secondary | ICD-10-CM

## 2022-02-19 DIAGNOSIS — Z79899 Other long term (current) drug therapy: Secondary | ICD-10-CM | POA: Insufficient documentation

## 2022-02-19 DIAGNOSIS — G35 Multiple sclerosis: Secondary | ICD-10-CM | POA: Insufficient documentation

## 2022-02-19 DIAGNOSIS — Z5111 Encounter for antineoplastic chemotherapy: Secondary | ICD-10-CM | POA: Insufficient documentation

## 2022-02-19 DIAGNOSIS — Z5112 Encounter for antineoplastic immunotherapy: Secondary | ICD-10-CM | POA: Insufficient documentation

## 2022-02-19 DIAGNOSIS — C787 Secondary malignant neoplasm of liver and intrahepatic bile duct: Secondary | ICD-10-CM | POA: Insufficient documentation

## 2022-02-19 DIAGNOSIS — C7951 Secondary malignant neoplasm of bone: Secondary | ICD-10-CM | POA: Insufficient documentation

## 2022-02-19 DIAGNOSIS — L0231 Cutaneous abscess of buttock: Secondary | ICD-10-CM | POA: Insufficient documentation

## 2022-02-19 DIAGNOSIS — G939 Disorder of brain, unspecified: Secondary | ICD-10-CM | POA: Insufficient documentation

## 2022-02-19 DIAGNOSIS — Z87891 Personal history of nicotine dependence: Secondary | ICD-10-CM | POA: Insufficient documentation

## 2022-02-19 DIAGNOSIS — I10 Essential (primary) hypertension: Secondary | ICD-10-CM | POA: Insufficient documentation

## 2022-02-19 NOTE — Progress Notes (Signed)
Virtual Visit via Telephone Note  I connected with Ryan Cannon on 02/19/22 at 10:30 AM EDT by telephone and verified that I am speaking with the correct person using two identifiers.  Location: Patient: Home Provider: Clinic   I discussed the limitations, risks, security and privacy concerns of performing an evaluation and management service by telephone and the availability of in person appointments. I also discussed with the patient that there may be a patient responsible charge related to this service. The patient expressed understanding and agreed to proceed.   History of Present Illness: Ryan Cannon is a 56 y.o. male with multiple medical problems including multiple sclerosis and recently found to have probable stage IV lung cancer with metastasis to scapula and possible liver.  Patient has a lytic destructive lesion involving the inferior angle of the right scapula.  Patient has had severe pain and was referred to palliative care to help address goals and manage ongoing symptoms.    Observations/Objective: I called and spoke with patient by phone.    Patient reports that he is doing reasonably well.  He denies significant changes or concerns.  He reports stable pain and is taking 3-4 oxycodone a day in addition to the transdermal fentanyl.   Patient was referred to pain management but left prior to establishing care.  Patient denies any acute changes or concerns today.  No need for refills presently.  Assessment and Plan: Stage IV lung cancer -on systemic chemotherapy.  Followed by Dr. Rogue Bussing  Neoplasm related pain -continue fentanyl/oxycodone.  Both were recently refilled.    Follow Up Instructions: Follow-up telephone visit 1 month   I discussed the assessment and treatment plan with the patient. The patient was provided an opportunity to ask questions and all were answered. The patient agreed with the plan and demonstrated an understanding of the instructions.    The patient was advised to call back or seek an in-person evaluation if the symptoms worsen or if the condition fails to improve as anticipated.  I provided 5 minutes of non-face-to-face time during this encounter.   Irean Hong, NP

## 2022-02-19 NOTE — Telephone Encounter (Signed)
Pt called to state that he was only able to fill #20 tablets at the pharmacy and wasn't sure why he didn't get more tablets like before. Spoke with Altha Harm at [facility] who stated that Medicaid is requiring PA for oxycodone. PA submitted via covermymeds and pharmacist requests new script to be sent over once PA approved.   Will update pt once PA approved and new script sent to pharmacy.

## 2022-02-20 ENCOUNTER — Other Ambulatory Visit: Payer: Self-pay | Admitting: *Deleted

## 2022-02-20 MED ORDER — OXYCODONE HCL 10 MG PO TABS: 10.0000 mg | ORAL_TABLET | Freq: Four times a day (QID) | ORAL | 0 refills | Status: DC | PRN

## 2022-02-20 NOTE — Telephone Encounter (Signed)
PA approved - [id number] - faxed to [facility]. New prescription for oxycodone sent to pharmacy. Pt made aware.

## 2022-02-21 ENCOUNTER — Inpatient Hospital Stay (HOSPITAL_BASED_OUTPATIENT_CLINIC_OR_DEPARTMENT_OTHER): Payer: Medicaid Other | Admitting: Internal Medicine

## 2022-02-21 ENCOUNTER — Inpatient Hospital Stay: Payer: Medicaid Other

## 2022-02-21 ENCOUNTER — Encounter: Payer: Self-pay | Admitting: Internal Medicine

## 2022-02-21 VITALS — BP 129/85 | HR 92 | Temp 97.4°F | Resp 16 | Wt 164.9 lb

## 2022-02-21 DIAGNOSIS — Z79899 Other long term (current) drug therapy: Secondary | ICD-10-CM | POA: Diagnosis not present

## 2022-02-21 DIAGNOSIS — C787 Secondary malignant neoplasm of liver and intrahepatic bile duct: Secondary | ICD-10-CM | POA: Diagnosis not present

## 2022-02-21 DIAGNOSIS — C3412 Malignant neoplasm of upper lobe, left bronchus or lung: Secondary | ICD-10-CM

## 2022-02-21 DIAGNOSIS — G939 Disorder of brain, unspecified: Secondary | ICD-10-CM | POA: Diagnosis not present

## 2022-02-21 DIAGNOSIS — Z87891 Personal history of nicotine dependence: Secondary | ICD-10-CM | POA: Diagnosis not present

## 2022-02-21 DIAGNOSIS — G35 Multiple sclerosis: Secondary | ICD-10-CM | POA: Diagnosis not present

## 2022-02-21 DIAGNOSIS — C7951 Secondary malignant neoplasm of bone: Secondary | ICD-10-CM | POA: Diagnosis not present

## 2022-02-21 DIAGNOSIS — Z5112 Encounter for antineoplastic immunotherapy: Secondary | ICD-10-CM | POA: Diagnosis present

## 2022-02-21 DIAGNOSIS — L0231 Cutaneous abscess of buttock: Secondary | ICD-10-CM | POA: Diagnosis not present

## 2022-02-21 DIAGNOSIS — Z5111 Encounter for antineoplastic chemotherapy: Secondary | ICD-10-CM | POA: Diagnosis not present

## 2022-02-21 DIAGNOSIS — I10 Essential (primary) hypertension: Secondary | ICD-10-CM | POA: Diagnosis not present

## 2022-02-21 LAB — COMPREHENSIVE METABOLIC PANEL
ALT: 10 U/L (ref 0–44)
AST: 24 U/L (ref 15–41)
Albumin: 3.5 g/dL (ref 3.5–5.0)
Alkaline Phosphatase: 56 U/L (ref 38–126)
Anion gap: 11 (ref 5–15)
BUN: 10 mg/dL (ref 6–20)
CO2: 26 mmol/L (ref 22–32)
Calcium: 9.4 mg/dL (ref 8.9–10.3)
Chloride: 99 mmol/L (ref 98–111)
Creatinine, Ser: 0.98 mg/dL (ref 0.61–1.24)
GFR, Estimated: >60 mL/min (ref >60)
Glucose, Bld: 140 mg/dL — ABNORMAL HIGH (ref 70–99)
Potassium: 4.3 mmol/L (ref 3.5–5.1)
Sodium: 136 mmol/L (ref 135–145)
Total Bilirubin: 0.2 mg/dL — ABNORMAL LOW (ref 0.3–1.2)
Total Protein: 9.1 g/dL — ABNORMAL HIGH (ref 6.5–8.1)

## 2022-02-21 LAB — CBC WITH DIFFERENTIAL/PLATELET
Abs Immature Granulocytes: 0.1 10*3/uL — ABNORMAL HIGH (ref 0.00–0.07)
Basophils Absolute: 0.1 10*3/uL (ref 0.0–0.1)
Basophils Relative: 0 %
Eosinophils Absolute: 0.2 10*3/uL (ref 0.0–0.5)
Eosinophils Relative: 2 %
HCT: 40.6 % (ref 39.0–52.0)
Hemoglobin: 12.8 g/dL — ABNORMAL LOW (ref 13.0–17.0)
Immature Granulocytes: 1 %
Lymphocytes Relative: 10 %
Lymphs Abs: 1.5 10*3/uL (ref 0.7–4.0)
MCH: 33.5 pg (ref 26.0–34.0)
MCHC: 31.5 g/dL (ref 30.0–36.0)
MCV: 106.3 fL — ABNORMAL HIGH (ref 80.0–100.0)
Monocytes Absolute: 1.3 10*3/uL — ABNORMAL HIGH (ref 0.1–1.0)
Monocytes Relative: 9 %
Neutro Abs: 11.3 10*3/uL — ABNORMAL HIGH (ref 1.7–7.7)
Neutrophils Relative %: 78 %
Platelets: 428 10*3/uL — ABNORMAL HIGH (ref 150–400)
RBC: 3.82 MIL/uL — ABNORMAL LOW (ref 4.22–5.81)
RDW: 14.9 % (ref 11.5–15.5)
WBC: 14.3 10*3/uL — ABNORMAL HIGH (ref 4.0–10.5)
nRBC: 0 % (ref 0.0–0.2)

## 2022-02-21 LAB — URINALYSIS, DIPSTICK ONLY
Bilirubin Urine: NEGATIVE
Glucose, UA: NEGATIVE mg/dL
Hgb urine dipstick: NEGATIVE
Ketones, ur: NEGATIVE mg/dL
Leukocytes,Ua: NEGATIVE
Nitrite: NEGATIVE
Protein, ur: NEGATIVE mg/dL
Specific Gravity, Urine: 1.017 (ref 1.005–1.030)
pH: 6 (ref 5.0–8.0)

## 2022-02-21 MED ORDER — SODIUM CHLORIDE 0.9 % IV SOLN
Freq: Once | INTRAVENOUS | Status: AC
  Filled 2022-02-21: qty 250

## 2022-02-21 MED ORDER — DEXAMETHASONE SODIUM PHOSPHATE 10 MG/ML IJ SOLN
4.0000 mg | Freq: Once | INTRAMUSCULAR | Status: AC
  Administered 2022-02-21: 4 mg via INTRAVENOUS
  Filled 2022-02-21: qty 1

## 2022-02-21 MED ORDER — ACETAMINOPHEN 325 MG PO TABS
650.0000 mg | ORAL_TABLET | Freq: Once | ORAL | Status: AC
  Administered 2022-02-21: 650 mg via ORAL
  Filled 2022-02-21: qty 2

## 2022-02-21 MED ORDER — SODIUM CHLORIDE 0.9 % IV SOLN
15.0000 mg/kg | Freq: Once | INTRAVENOUS | Status: AC
  Administered 2022-02-21: 1200 mg via INTRAVENOUS
  Filled 2022-02-21: qty 48

## 2022-02-21 MED ORDER — SODIUM CHLORIDE 0.9 % IV SOLN
500.0000 mg/m2 | Freq: Once | INTRAVENOUS | Status: AC
  Administered 2022-02-21: 1000 mg via INTRAVENOUS
  Filled 2022-02-21: qty 40
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
## 2022-02-21 NOTE — Progress Notes (Signed)
Pt states he does not have an appetite after chemo txs.

## 2022-02-21 NOTE — Patient Instructions (Signed)
[facility]  Discharge Instructions: Thank you for choosing [facility] to provide your oncology and hematology care.  If you have a lab appointment with the Cancer Center, please go directly to the Cancer Center and check in at the registration area.  Wear comfortable clothing and clothing appropriate for easy access to any Portacath or PICC line.   We strive to give you quality time with your provider. You may need to reschedule your appointment if you arrive late (15 or more minutes).  Arriving late affects you and other patients whose appointments are after yours.  Also, if you miss three or more appointments without notifying the office, you may be dismissed from the clinic at the provider's discretion.      For prescription refill requests, have your pharmacy contact our office and allow 72 hours for refills to be completed.       To help prevent nausea and vomiting after your treatment, we encourage you to take your nausea medication as directed.  BELOW ARE SYMPTOMS THAT SHOULD BE REPORTED IMMEDIATELY: *FEVER GREATER THAN 100.4 F (38 C) OR HIGHER *CHILLS OR SWEATING *NAUSEA AND VOMITING THAT IS NOT CONTROLLED WITH YOUR NAUSEA MEDICATION *UNUSUAL SHORTNESS OF BREATH *UNUSUAL BRUISING OR BLEEDING *URINARY PROBLEMS (pain or burning when urinating, or frequent urination) *BOWEL PROBLEMS (unusual diarrhea, constipation, pain near the anus) TENDERNESS IN MOUTH AND THROAT WITH OR WITHOUT PRESENCE OF ULCERS (sore throat, sores in mouth, or a toothache) UNUSUAL RASH, SWELLING OR PAIN  UNUSUAL VAGINAL DISCHARGE OR ITCHING   Items with * indicate a potential emergency and should be followed up as soon as possible or go to the Emergency Department if any problems should occur.  Please show the CHEMOTHERAPY ALERT CARD or IMMUNOTHERAPY ALERT CARD at check-in to the Emergency Department and triage nurse.  Should you have questions after your  visit or need to cancel or reschedule your appointment, please contact [facility]  [phone] and follow the prompts.  Office hours are 8:00 a.m. to 4:30 p.m. Monday - Friday. Please note that voicemails left after 4:00 p.m. may not be returned until the following business day.  We are closed weekends and major holidays. You have access to a nurse at all times for urgent questions. Please call the main number to the clinic [phone] and follow the prompts.  For any non-urgent questions, you may also contact your provider using MyChart. We now offer e-Visits for anyone 18 and older to request care online for non-urgent symptoms. For details visit [email or web address].   Also download the MyChart app! Go to the app store, search "MyChart", open the app, select [facility], and log in with your MyChart username and password.  Masks are optional in the cancer centers. If you would like for your care team to wear a mask while they are taking care of you, please let them know. For doctor visits, patients may have with them one support person who is at least 58 years old. At this time, visitors are not allowed in the infusion area.   
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
## 2022-03-07 ENCOUNTER — Other Ambulatory Visit: Payer: Self-pay | Admitting: *Deleted

## 2022-03-07 MED ORDER — OXYCODONE HCL 10 MG PO TABS: 10.0000 mg | ORAL_TABLET | Freq: Four times a day (QID) | ORAL | 0 refills | Status: DC | PRN

## 2022-03-11 ENCOUNTER — Encounter: Payer: Self-pay | Admitting: Pulmonary Disease

## 2022-03-11 ENCOUNTER — Ambulatory Visit (INDEPENDENT_AMBULATORY_CARE_PROVIDER_SITE_OTHER): Payer: Medicaid Other | Admitting: Pulmonary Disease

## 2022-03-11 VITALS — BP 120/60 | HR 100 | Temp 98.1°F | Ht 69.0 in | Wt 163.2 lb

## 2022-03-11 DIAGNOSIS — R0602 Shortness of breath: Secondary | ICD-10-CM | POA: Diagnosis not present

## 2022-03-11 DIAGNOSIS — C3490 Malignant neoplasm of unspecified part of unspecified bronchus or lung: Secondary | ICD-10-CM

## 2022-03-11 DIAGNOSIS — J449 Chronic obstructive pulmonary disease, unspecified: Secondary | ICD-10-CM

## 2022-03-11 DIAGNOSIS — G35 Multiple sclerosis: Secondary | ICD-10-CM

## 2022-03-11 MED ORDER — ALBUTEROL SULFATE HFA 108 (90 BASE) MCG/ACT IN AERS: 2.0000 | INHALATION_SPRAY | Freq: Four times a day (QID) | RESPIRATORY_TRACT | 2 refills | Status: DC | PRN

## 2022-03-11 MED ORDER — TRELEGY ELLIPTA 100-62.5-25 MCG/ACT IN AEPB: 1.0000 | INHALATION_SPRAY | Freq: Every day | RESPIRATORY_TRACT | 0 refills | Status: DC

## 2022-03-11 NOTE — Patient Instructions (Signed)
We are giving you a trial of an inhaler called Trelegy Ellipta that this is 1 puff daily.  Make sure you rinse your mouth well after you use it.  Do let us know how you are doing with that so that we can call the prescription in for you.  We also sent a prescription for an inhaler called Ventolin (albuterol) this is an inhaler that you can carry with you at all times you can use it up to 4 times a day as needed for shortness of breath or wheezing.  We are getting some breathing test scheduled to check on your lungs.  We will see you in follow-up in 4 to 6 weeks time call sooner should any new problems arise.
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
## 2022-03-12 ENCOUNTER — Ambulatory Visit
Admission: RE | Admit: 2022-03-12 | Discharge: 2022-03-12 | Disposition: A | Discharge status: Home / Self Care | Payer: Medicaid Other | Source: Ambulatory Visit | Attending: Internal Medicine | Admitting: Internal Medicine

## 2022-03-12 ENCOUNTER — Inpatient Hospital Stay: Payer: Medicaid Other

## 2022-03-12 DIAGNOSIS — C3412 Malignant neoplasm of upper lobe, left bronchus or lung: Secondary | ICD-10-CM | POA: Diagnosis present

## 2022-03-12 LAB — GLUCOSE, CAPILLARY: Glucose-Capillary: 106 mg/dL — ABNORMAL HIGH (ref 70–99)

## 2022-03-12 MED ORDER — FLUDEOXYGLUCOSE F - 18 (FDG) INJECTION
8.5000 | Freq: Once | INTRAVENOUS | Status: AC | PRN
  Administered 2022-03-12: 9.06 via INTRAVENOUS

## 2022-03-13 ENCOUNTER — Inpatient Hospital Stay (HOSPITAL_BASED_OUTPATIENT_CLINIC_OR_DEPARTMENT_OTHER): Payer: Medicaid Other | Admitting: Hospice and Palliative Medicine

## 2022-03-13 DIAGNOSIS — C3412 Malignant neoplasm of upper lobe, left bronchus or lung: Secondary | ICD-10-CM

## 2022-03-13 DIAGNOSIS — G893 Neoplasm related pain (acute) (chronic): Secondary | ICD-10-CM | POA: Diagnosis not present

## 2022-03-13 DIAGNOSIS — Z515 Encounter for palliative care: Secondary | ICD-10-CM

## 2022-03-13 NOTE — Progress Notes (Signed)
Virtual Visit via Telephone Note  I connected with Ryan Cannon on 03/13/22 at  9:30 AM EDT by telephone and verified that I am speaking with the correct person using two identifiers.  Location: Patient: Home Provider: Clinic   I discussed the limitations, risks, security and privacy concerns of performing an evaluation and management service by telephone and the availability of in person appointments. I also discussed with the patient that there may be a patient responsible charge related to this service. The patient expressed understanding and agreed to proceed.   History of Present Illness: Ryan Cannon is a 56 y.o. male with multiple medical problems including multiple sclerosis and recently found to have probable stage IV lung cancer with metastasis to scapula and possible liver.  Patient has a lytic destructive lesion involving the inferior angle of the right scapula.  Patient has had severe pain and was referred to palliative care to help address goals and manage ongoing symptoms.    Observations/Objective: I called and spoke with patient by phone.    Patient reports he is doing well. He says, "I am starting to feel better." He denies significant or distressing symptoms today. He reports pain is stable on fentanyl and that he takes oxycodone on average 2-3 times per day. Overall, pain is well controlled. No adverse effects reported from pain meds.   Patient is s/p PET yesterday.   Assessment and Plan: Stage IV lung cancer -on systemic chemotherapy.  Followed by Dr. Rogue Bussing who sees patient tomorrow to discuss PET results  Neoplasm related pain -continue fentanyl/oxycodone.  Both were recently refilled.    Follow Up Instructions: Follow-up telephone visit 1-2 months   I discussed the assessment and treatment plan with the patient. The patient was provided an opportunity to ask questions and all were answered. The patient agreed with the plan and demonstrated an understanding  of the instructions.   The patient was advised to call back or seek an in-person evaluation if the symptoms worsen or if the condition fails to improve as anticipated.  I provided 5 minutes of non-face-to-face time during this encounter.   Irean Hong, NP

## 2022-03-14 ENCOUNTER — Inpatient Hospital Stay: Payer: Medicaid Other

## 2022-03-14 ENCOUNTER — Encounter: Payer: Self-pay | Admitting: Internal Medicine

## 2022-03-14 ENCOUNTER — Inpatient Hospital Stay (HOSPITAL_BASED_OUTPATIENT_CLINIC_OR_DEPARTMENT_OTHER): Payer: Medicaid Other | Admitting: Internal Medicine

## 2022-03-14 ENCOUNTER — Telehealth: Payer: Self-pay | Admitting: *Deleted

## 2022-03-14 VITALS — BP 112/85 | HR 70 | Temp 93.8°F | Ht 69.0 in | Wt 164.8 lb

## 2022-03-14 DIAGNOSIS — C3412 Malignant neoplasm of upper lobe, left bronchus or lung: Secondary | ICD-10-CM | POA: Diagnosis not present

## 2022-03-14 DIAGNOSIS — L0291 Cutaneous abscess, unspecified: Secondary | ICD-10-CM

## 2022-03-14 DIAGNOSIS — Z5111 Encounter for antineoplastic chemotherapy: Secondary | ICD-10-CM | POA: Diagnosis not present

## 2022-03-14 LAB — CBC WITH DIFFERENTIAL/PLATELET
Abs Immature Granulocytes: 0.07 10*3/uL (ref 0.00–0.07)
Basophils Absolute: 0 10*3/uL (ref 0.0–0.1)
Basophils Relative: 0 %
Eosinophils Absolute: 0.1 10*3/uL (ref 0.0–0.5)
Eosinophils Relative: 1 %
HCT: 37.5 % — ABNORMAL LOW (ref 39.0–52.0)
Hemoglobin: 11.8 g/dL — ABNORMAL LOW (ref 13.0–17.0)
Immature Granulocytes: 1 %
Lymphocytes Relative: 15 %
Lymphs Abs: 1.5 10*3/uL (ref 0.7–4.0)
MCH: 32.9 pg (ref 26.0–34.0)
MCHC: 31.5 g/dL (ref 30.0–36.0)
MCV: 104.5 fL — ABNORMAL HIGH (ref 80.0–100.0)
Monocytes Absolute: 1.1 10*3/uL — ABNORMAL HIGH (ref 0.1–1.0)
Monocytes Relative: 11 %
Neutro Abs: 7.5 10*3/uL (ref 1.7–7.7)
Neutrophils Relative %: 72 %
Platelets: 416 10*3/uL — ABNORMAL HIGH (ref 150–400)
RBC: 3.59 MIL/uL — ABNORMAL LOW (ref 4.22–5.81)
RDW: 15.1 % (ref 11.5–15.5)
WBC: 10.2 10*3/uL (ref 4.0–10.5)
nRBC: 0 % (ref 0.0–0.2)

## 2022-03-14 LAB — COMPREHENSIVE METABOLIC PANEL
ALT: 7 U/L (ref 0–44)
AST: 17 U/L (ref 15–41)
Albumin: 3.2 g/dL — ABNORMAL LOW (ref 3.5–5.0)
Alkaline Phosphatase: 50 U/L (ref 38–126)
Anion gap: 8 (ref 5–15)
BUN: 19 mg/dL (ref 6–20)
CO2: 23 mmol/L (ref 22–32)
Calcium: 8.9 mg/dL (ref 8.9–10.3)
Chloride: 103 mmol/L (ref 98–111)
Creatinine, Ser: 1.05 mg/dL (ref 0.61–1.24)
GFR, Estimated: >60 mL/min (ref >60)
Glucose, Bld: 103 mg/dL — ABNORMAL HIGH (ref 70–99)
Potassium: 3.9 mmol/L (ref 3.5–5.1)
Sodium: 134 mmol/L — ABNORMAL LOW (ref 135–145)
Total Bilirubin: 0.4 mg/dL (ref 0.3–1.2)
Total Protein: 8.5 g/dL — ABNORMAL HIGH (ref 6.5–8.1)

## 2022-03-14 LAB — URINALYSIS, DIPSTICK ONLY
Bilirubin Urine: NEGATIVE
Glucose, UA: NEGATIVE mg/dL
Hgb urine dipstick: NEGATIVE
Ketones, ur: 5 mg/dL — AB
Leukocytes,Ua: NEGATIVE
Nitrite: NEGATIVE
Protein, ur: NEGATIVE mg/dL
Specific Gravity, Urine: 1.027 (ref 1.005–1.030)
pH: 5 (ref 5.0–8.0)

## 2022-03-14 MED ORDER — AMOXICILLIN 500 MG PO CAPS: 500.0000 mg | ORAL_CAPSULE | Freq: Three times a day (TID) | ORAL | 0 refills | Status: DC

## 2022-03-14 NOTE — Addendum Note (Signed)
Addended by: Telford Nab on: 03/14/2022 01:09 PM   Modules accepted: Orders

## 2022-03-14 NOTE — Progress Notes (Unsigned)
No issues

## 2022-03-14 NOTE — Assessment & Plan Note (Addendum)
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 CI to immunotherapy].   AUG 21st, 2023-PETscan-  Response to therapy since the prior PET exam. Still with residual FDG uptake in treated areas. In the LEFT chest findings are nonspecific, some residual viable tumor is considered particularly in the central RIGHT chest adjacent to RIGHT hilum. Residual uptake in the dome of the RIGHT hemiliver. Suggest close attention on follow-up. Marked response to therapy of RIGHT scapular lesion with only minimal FDG uptake in no measurable soft tissue in this location. Subtle lucency in the LEFT fifth rib is at the same level of potential post radiation changes and is of uncertain significance, osteitis versus or early metastasis.    # Currently on Alimta-Avastin maintenance  # Proceed with chemo-maintenance Alimta Avastin. Labs today reviewed;  acceptable for treatment today.  Discussed with the patient that the treatments are indefinite.   #   # HTN--on amlodipine ;-proceed with Avastin today.  Stable.  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 JUNE 20th MRI [WO CONTRAST]-improvement of the edema; smaller lesion.  Continue monitoring.  We will repeat imaging again in September October 2023/order MRI at next visit.STABLE.  # multiple sclerosis-on Avonex  SQ. discussed with Dr. Manuella Ghazi; neurology-states multiple sclerosis-   STABLE.   # Pain right scapula-secondary metastatic malignancy - s/p  RT [Jan 30th-Feb, 13th]- on oxycodone 10- mg  Up to 4 times a day; on fenatnyl Patch 47mg; refilled.  Consider zometa [needs dental clearance]. STABLE.  # Left buttock- abcess- fluctuant/warm to touc- recommend evaluation with surgery.   # Smoking: cutting down quit smoking-  intermittently. STABLE  # IV access: PIV [Declined port placement].   *pt cant drive/ [employer]  * BF02q 3 cycles- 6-23;   # DISPOSITION:   # TODAY HOLD  Alimta- Avastin   # follow up TBD- Dr.B  # I reviewed the blood work- with the patient in detail; also reviewed the imaging independently [as summarized above]; and with the patient in detail.

## 2022-03-14 NOTE — Progress Notes (Signed)
Patient was referred to [facility] general surgery, Dr. Peyton Najjar for Monday 03/17/22 but they do not accept his insurance.  Patient is now being referred to [place].

## 2022-03-14 NOTE — Telephone Encounter (Signed)
Pt currently scheduled with [facility] for 9/11. Reached out to clinic to see if appt can be scheduled any sooner in order to keep from delaying pt's treatment any longer. Office currently closed but will follow up on Monday.
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
## 2022-03-14 NOTE — Telephone Encounter (Signed)
Pt received appt for general surgery at [facility] for Monday and states that their clinic does not accept his insurance. Pt asks if can be referred to another surgeon's office. Referral placed to [facility] per Dr. B for buttocks abscess.

## 2022-03-17 ENCOUNTER — Ambulatory Visit: Payer: Medicaid Other | Admitting: Surgery

## 2022-03-18 ENCOUNTER — Institutional Professional Consult (permissible substitution): Payer: Medicaid Other | Admitting: Pulmonary Disease

## 2022-03-18 ENCOUNTER — Encounter: Payer: Self-pay | Admitting: Internal Medicine

## 2022-03-19 ENCOUNTER — Inpatient Hospital Stay: Payer: Medicaid Other

## 2022-03-19 ENCOUNTER — Ambulatory Visit (INDEPENDENT_AMBULATORY_CARE_PROVIDER_SITE_OTHER): Payer: Medicaid Other | Admitting: Surgery

## 2022-03-19 ENCOUNTER — Encounter: Payer: Self-pay | Admitting: Surgery

## 2022-03-19 VITALS — BP 133/91 | HR 79 | Temp 98.2°F | Ht 69.0 in | Wt 164.0 lb

## 2022-03-19 DIAGNOSIS — L0231 Cutaneous abscess of buttock: Secondary | ICD-10-CM

## 2022-03-19 DIAGNOSIS — L732 Hidradenitis suppurativa: Secondary | ICD-10-CM

## 2022-03-19 MED ORDER — DOXYCYCLINE HYCLATE 100 MG PO CAPS: 100.0000 mg | ORAL_CAPSULE | Freq: Two times a day (BID) | ORAL | 0 refills | Status: DC

## 2022-03-19 MED ORDER — OXYCODONE HCL 5 MG PO TABS: 5.0000 mg | ORAL_TABLET | Freq: Four times a day (QID) | ORAL | 0 refills | Status: DC | PRN
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
## 2022-03-19 NOTE — Patient Instructions (Addendum)
We are going to change your antibiotics. Please pick these up and start them today, and stop your other antibiotics.   We have sent a referral to dermatology for management of hidradenitis. They will call you to schedule this.   We will schedule you for surgery at [facility] to clean out the area. Please see your [facility] surgery sheet for more information. Our surgery scheduler will call you to look at surgery dates and to go over information.    Hidradenitis Suppurativa Hidradenitis suppurativa is a long-term (chronic) skin disease. It is similar to a severe form of acne, but it affects areas of the body where acne would be unusual, especially areas of the body where skin rubs against skin and becomes moist. These include: Underarms. Groin. Genital area. Buttocks. Upper thighs. Breasts. Hidradenitis suppurativa may start out as small lumps or pimples caused by blocked skin pores, sweat glands, or hair follicles. Pimples may develop into deep sores that break open (rupture) and drain pus. Over time, affected areas of skin may thicken and become scarred. This condition is rare and does not spread from person to person (non-contagious). What are the causes? The exact cause of this condition is not known. It may be related to: Male and male hormones. An overactive disease-fighting system (immune system). The immune system may over-react to blocked hair follicles or sweat glands and cause swelling and pus-filled sores. What increases the risk? You are more likely to develop this condition if you: Are male. Are 22-70 years old. Have a family history of hidradenitis suppurativa. Have a personal history of acne. Are overweight. Smoke. Take the medicine lithium. What are the signs or symptoms? The first symptoms are usually painful bumps in the skin, similar to pimples. The condition may get worse over time (progress), or it may only cause mild symptoms. If the disease progresses, symptoms may  include: Skin bumps getting bigger and growing deeper into the skin. Bumps rupturing and draining pus. Itchy, infected skin. Skin getting thicker and scarred. Tunnels under the skin (fistulas) where pus drains from a bump. Pain during daily activities, such as pain during walking if your groin area is affected. Emotional problems, such as stress or depression. This condition may affect your appearance and your ability or willingness to wear certain clothes or do certain activities. How is this diagnosed? This condition is diagnosed by a health care provider who specializes in skin conditions (dermatologist). You may be diagnosed based on: Your symptoms and medical history. A physical exam. Testing a pus sample for infection. Blood tests. How is this treated? Your treatment will depend on how severe your symptoms are. The same treatment will not work for everybody with this condition. You may need to try several treatments to find what works best for you. Treatment may include: Cleaning and bandaging (dressing) your wounds as needed. Lifestyle changes, such as new skin care routines. Taking medicines, such as: Antibiotics. Acne medicines. Medicines to reduce the activity of the immune system. A diabetes medicine (metformin). Birth control pills, for women. Steroids to reduce swelling and pain. Working with a mental health care provider, if you experience emotional distress due to this condition. If you have severe symptoms that do not get better with medicine, you may need surgery. Surgery may involve: Using a laser to clear the skin and remove hair follicles. Opening and draining deep sores. Removing the areas of skin that are diseased and scarred. Follow these instructions at home: Medicines  Take over-the-counter and prescription  medicines only as told by your health care provider. If you were prescribed antibiotics, take them as told by your health care provider. Do not stop  using the antibiotic even if your condition improves. Skin care If you have open wounds, cover them with a clean dressing as told by your health care provider. Keep wounds clean by washing them gently with soap and water when you bathe. Do not shave the areas where you get hidradenitis suppurativa. Wear loose-fitting clothes. Try to avoid getting overheated or sweaty. If you get sweaty or wet, change into clean, dry clothes as soon as you can. To help relieve pain and itchiness, cover sore areas with a warm, clean washcloth (warm compress) for 5-10 minutes as often as needed. Your healthcare provider may recommend an antiperspirant deodorant that may be gentle on your skin. A daily antiseptic wash to cleanse affected areas may be suggested by your healthcare provider. General instructions Learn as much as you can about your disease so that you have an active role in your treatment. Work closely with your health care provider to find treatments that work for you. If you are overweight, work with your health care provider to lose weight as recommended. Do not use any products that contain nicotine or tobacco. These products include cigarettes, chewing tobacco, and vaping devices, such as e-cigarettes. If you need help quitting, ask your health care provider. If you struggle with living with this condition, talk with your health care provider or work with a mental health care provider as recommended. Keep all follow-up visits. Where to find more information Hidradenitis [place].: www.hs-foundation.org American Academy of Dermatology: http://jones-macias.info/ Contact a health care provider if: You have a flare-up of hidradenitis suppurativa. You have a fever or chills. You have trouble controlling your symptoms at home. You have trouble doing your daily activities because of your symptoms. You have trouble dealing with emotional problems related to your condition. Summary Hidradenitis  suppurativa is a long-term (chronic) skin disease. It is similar to a severe form of acne, but it affects areas of the body where acne would be unusual. The first symptoms are usually painful bumps in the skin, similar to pimples. The condition may only cause mild symptoms, or it may get worse over time (progress). If you have open wounds, cover them with a clean dressing as told by your health care provider. Keep wounds clean by washing them gently with soap and water when you bathe. Besides skin care, treatment may include medicines, laser treatment, and surgery. This information is not intended to replace advice given to you by your health care provider. Make sure you discuss any questions you have with your health care provider. Document Revised: 08/28/2021 Document Reviewed: 08/28/2021 Elsevier Patient Education  [place].

## 2022-03-20 ENCOUNTER — Telehealth: Payer: Self-pay | Admitting: Surgery

## 2022-03-20 NOTE — Telephone Encounter (Signed)
Patient has been advised of Pre-Admission date/time, and Surgery date at [facility].  Surgery Date: 03/25/22 @ [place] Preadmission Testing Date: 03/21/22 (phone 8a-1p)  Patient has been made aware to call [phone], between 1-3:00pm the day before surgery, to find out what time to arrive for surgery.

## 2022-03-21 ENCOUNTER — Encounter
Admission: RE | Admit: 2022-03-21 | Discharge: 2022-03-21 | Disposition: A | Discharge status: Home / Self Care | Payer: Medicaid Other | Source: Ambulatory Visit | Attending: Surgery | Admitting: Surgery

## 2022-03-21 ENCOUNTER — Telehealth: Payer: Medicaid Other | Admitting: Hospice and Palliative Medicine

## 2022-03-21 VITALS — Ht 69.0 in | Wt 170.0 lb

## 2022-03-21 DIAGNOSIS — I10 Essential (primary) hypertension: Secondary | ICD-10-CM

## 2022-03-21 NOTE — Patient Instructions (Signed)
Your procedure is scheduled on: 03/25/22 Report to [facility]. To find out your arrival time please call [phone] between 1PM - 3PM on 03/21/22.  Remember: Instructions that are not followed completely may result in serious medical risk, up to and including death, or upon the discretion of your surgeon and anesthesiologist your surgery may need to be rescheduled.     _X__ 1. Do not eat food after midnight the night before your procedure.                 No gum chewing or hard candies. You may drink clear liquids up to 2 hours                 before you are scheduled to arrive for your surgery- DO not drink clear                 liquids within 2 hours of the start of your surgery.                 Clear Liquids include:  water, apple juice without pulp, clear carbohydrate                 drink such as Clearfast or Gatorade, Black Coffee or Tea (Do not add                 anything to coffee or tea). Diabetics water only  __X__2.  On the morning of surgery brush your teeth with toothpaste and water, you                 may rinse your mouth with mouthwash if you wish.  Do not swallow any              toothpaste of mouthwash.     _X__ 3.  No Alcohol for 24 hours before or after surgery.   _X__ 4.  Do Not Smoke or use e-cigarettes For 24 Hours Prior to Your Surgery.                 Do not use any chewable tobacco products for at least 6 hours prior to                 surgery.  ____  5.  Bring all medications with you on the day of surgery if instructed.   __X__  6.  Notify your doctor if there is any change in your medical condition      (cold, fever, infections).     Do not wear jewelry, make-up, hairpins, clips or nail polish. Do not wear lotions, powders, or perfumes.  Do not shave 48 hours prior to surgery. Men may shave face and neck. Do not bring valuables to the hospital.    [facility] is not responsible for any belongings or  valuables.  Contacts, dentures/partials or body piercings may not be worn into surgery. Bring a case for your contacts, glasses or hearing aids, a denture cup will be supplied. Leave your suitcase in the car. After surgery it may be brought to your room. For patients admitted to the hospital, discharge time is determined by your treatment team.   Patients discharged the day of surgery will not be allowed to drive home.    __X__ Take these medicines the morning of surgery with A SIP OF WATER:    1. amLODipine (NORVASC) 5 MG tablet  2. Oxycodone HCl 10 MG TABS if  needed  3.   4.  5.  6.  ____ Fleet Enema (as directed)   __X__ Use CHG Soap/SAGE wipes as directed  __X__ Use inhalers on the day of surgery  ____ Stop metformin/Janumet/Farxiga 2 days prior to surgery    ____ Take 1/2 of usual insulin dose the night before surgery. No insulin the morning          of surgery.   ____ Stop Blood Thinners Coumadin/Plavix/Xarelto/Pleta/Pradaxa/Eliquis/Effient/Aspirin  on   Or contact your Surgeon, Cardiologist or Medical Doctor regarding  ability to stop your blood thinners  __X__ Stop Anti-inflammatories 7 days before surgery such as Advil, Ibuprofen, Motrin,  BC or Goodies Powder, Naprosyn, Naproxen, Aleve, Aspirin    __X__ Stop all herbal supplements, fish oil or vitamin E until after surgery.    ____ Bring C-Pap to the hospital.
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
## 2022-03-25 ENCOUNTER — Ambulatory Visit: Payer: Medicaid Other | Admitting: Anesthesiology

## 2022-03-25 ENCOUNTER — Other Ambulatory Visit: Payer: Self-pay | Admitting: *Deleted

## 2022-03-25 ENCOUNTER — Other Ambulatory Visit: Payer: Self-pay

## 2022-03-25 ENCOUNTER — Ambulatory Visit
Admission: RE | Admit: 2022-03-25 | Discharge: 2022-03-25 | Disposition: A | Discharge status: Home / Self Care | Payer: Medicaid Other | Attending: Surgery | Admitting: Surgery

## 2022-03-25 ENCOUNTER — Encounter: Payer: Self-pay | Admitting: Surgery

## 2022-03-25 ENCOUNTER — Encounter
Admission: RE | Disposition: A | Discharge status: Home / Self Care | Payer: Self-pay | Source: Home / Self Care | Attending: Surgery

## 2022-03-25 DIAGNOSIS — C3490 Malignant neoplasm of unspecified part of unspecified bronchus or lung: Secondary | ICD-10-CM | POA: Diagnosis not present

## 2022-03-25 DIAGNOSIS — L0231 Cutaneous abscess of buttock: Secondary | ICD-10-CM | POA: Insufficient documentation

## 2022-03-25 DIAGNOSIS — I10 Essential (primary) hypertension: Secondary | ICD-10-CM | POA: Diagnosis not present

## 2022-03-25 DIAGNOSIS — Z87891 Personal history of nicotine dependence: Secondary | ICD-10-CM | POA: Insufficient documentation

## 2022-03-25 DIAGNOSIS — Z01810 Encounter for preprocedural cardiovascular examination: Secondary | ICD-10-CM | POA: Diagnosis not present

## 2022-03-25 DIAGNOSIS — D759 Disease of blood and blood-forming organs, unspecified: Secondary | ICD-10-CM | POA: Insufficient documentation

## 2022-03-25 DIAGNOSIS — C7951 Secondary malignant neoplasm of bone: Secondary | ICD-10-CM | POA: Diagnosis not present

## 2022-03-25 DIAGNOSIS — C787 Secondary malignant neoplasm of liver and intrahepatic bile duct: Secondary | ICD-10-CM | POA: Insufficient documentation

## 2022-03-25 DIAGNOSIS — G35 Multiple sclerosis: Secondary | ICD-10-CM | POA: Insufficient documentation

## 2022-03-25 DIAGNOSIS — Z79891 Long term (current) use of opiate analgesic: Secondary | ICD-10-CM | POA: Diagnosis not present

## 2022-03-25 DIAGNOSIS — L732 Hidradenitis suppurativa: Secondary | ICD-10-CM | POA: Insufficient documentation

## 2022-03-25 DIAGNOSIS — D649 Anemia, unspecified: Secondary | ICD-10-CM | POA: Diagnosis not present

## 2022-03-25 DIAGNOSIS — G8929 Other chronic pain: Secondary | ICD-10-CM | POA: Diagnosis not present

## 2022-03-25 HISTORY — PX: INCISION AND DRAINAGE ABSCESS: SHX5864

## 2022-03-25 SURGERY — INCISION AND DRAINAGE, ABSCESS
Anesthesia: General | Site: Buttocks | Laterality: Left

## 2022-03-25 MED ORDER — BUPIVACAINE LIPOSOME 1.3 % IJ SUSP: 20.0000 mL | Freq: Once | INTRAMUSCULAR | Status: DC

## 2022-03-25 MED ORDER — ACETAMINOPHEN 500 MG PO TABS
ORAL_TABLET | ORAL | Status: AC
  Administered 2022-03-25: 1000 mg via ORAL
  Filled 2022-03-25: qty 2

## 2022-03-25 MED ORDER — GLYCOPYRROLATE 0.2 MG/ML IJ SOLN
INTRAMUSCULAR | Status: AC
Start: 2022-03-25 — End: ?
  Filled 2022-03-25: qty 1

## 2022-03-25 MED ORDER — KETAMINE HCL 10 MG/ML IJ SOLN
INTRAMUSCULAR | Status: DC | PRN
  Administered 2022-03-25: 20 mg via INTRAVENOUS
  Administered 2022-03-25: 10 mg via INTRAVENOUS

## 2022-03-25 MED ORDER — CEFAZOLIN SODIUM-DEXTROSE 2-4 GM/100ML-% IV SOLN
INTRAVENOUS | Status: AC
  Filled 2022-03-25: qty 100

## 2022-03-25 MED ORDER — ONDANSETRON HCL 4 MG/2ML IJ SOLN
INTRAMUSCULAR | Status: AC
  Filled 2022-03-25: qty 2

## 2022-03-25 MED ORDER — FAMOTIDINE 20 MG PO TABS: 20.0000 mg | ORAL_TABLET | Freq: Once | ORAL | Status: AC

## 2022-03-25 MED ORDER — IBUPROFEN 800 MG PO TABS: 800.0000 mg | ORAL_TABLET | Freq: Three times a day (TID) | ORAL | 1 refills | Status: DC | PRN

## 2022-03-25 MED ORDER — FAMOTIDINE 20 MG PO TABS
ORAL_TABLET | ORAL | Status: AC
  Administered 2022-03-25: 20 mg via ORAL
  Filled 2022-03-25: qty 1

## 2022-03-25 MED ORDER — DROPERIDOL 2.5 MG/ML IJ SOLN: 0.6250 mg | Freq: Once | INTRAMUSCULAR | Status: DC | PRN

## 2022-03-25 MED ORDER — GABAPENTIN 300 MG PO CAPS
ORAL_CAPSULE | ORAL | Status: AC
  Administered 2022-03-25: 300 mg via ORAL
  Filled 2022-03-25: qty 1

## 2022-03-25 MED ORDER — BUPIVACAINE-EPINEPHRINE 0.5% -1:200000 IJ SOLN
INTRAMUSCULAR | Status: DC | PRN
  Administered 2022-03-25: 50 mL via INTRAMUSCULAR

## 2022-03-25 MED ORDER — PROPOFOL 10 MG/ML IV BOLUS
INTRAVENOUS | Status: AC
  Filled 2022-03-25: qty 20

## 2022-03-25 MED ORDER — MIDAZOLAM HCL 2 MG/2ML IJ SOLN
INTRAMUSCULAR | Status: DC | PRN
  Administered 2022-03-25: 2 mg via INTRAVENOUS

## 2022-03-25 MED ORDER — PHENYLEPHRINE HCL (PRESSORS) 10 MG/ML IV SOLN
INTRAVENOUS | Status: AC
  Filled 2022-03-25: qty 1

## 2022-03-25 MED ORDER — ORAL CARE MOUTH RINSE: 15.0000 mL | Freq: Once | OROMUCOSAL | Status: AC

## 2022-03-25 MED ORDER — LACTATED RINGERS IV SOLN: INTRAVENOUS | Status: DC

## 2022-03-25 MED ORDER — OXYCODONE HCL 10 MG PO TABS
10.0000 mg | ORAL_TABLET | Freq: Four times a day (QID) | ORAL | 0 refills | Status: DC | PRN
Start: 2022-03-25 — End: 2022-04-08

## 2022-03-25 MED ORDER — LIDOCAINE HCL (CARDIAC) PF 100 MG/5ML IV SOSY
PREFILLED_SYRINGE | INTRAVENOUS | Status: DC | PRN
  Administered 2022-03-25: 100 mg via INTRAVENOUS

## 2022-03-25 MED ORDER — ACETAMINOPHEN 500 MG PO TABS: 1000.0000 mg | ORAL_TABLET | ORAL | Status: AC

## 2022-03-25 MED ORDER — GABAPENTIN 300 MG PO CAPS: 300.0000 mg | ORAL_CAPSULE | ORAL | Status: AC

## 2022-03-25 MED ORDER — ACETAMINOPHEN 500 MG PO TABS
1000.0000 mg | ORAL_TABLET | Freq: Four times a day (QID) | ORAL | Status: DC | PRN
Start: 2022-03-25 — End: 2022-09-23

## 2022-03-25 MED ORDER — BUPIVACAINE LIPOSOME 1.3 % IJ SUSP
INTRAMUSCULAR | Status: AC
  Filled 2022-03-25: qty 20

## 2022-03-25 MED ORDER — MIDAZOLAM HCL 2 MG/2ML IJ SOLN
INTRAMUSCULAR | Status: AC
  Filled 2022-03-25: qty 2

## 2022-03-25 MED ORDER — KETOROLAC TROMETHAMINE 30 MG/ML IJ SOLN
INTRAMUSCULAR | Status: AC
  Filled 2022-03-25: qty 1

## 2022-03-25 MED ORDER — ONDANSETRON HCL 4 MG/2ML IJ SOLN
INTRAMUSCULAR | Status: DC | PRN
  Administered 2022-03-25: 4 mg via INTRAVENOUS

## 2022-03-25 MED ORDER — PROPOFOL 10 MG/ML IV BOLUS
INTRAVENOUS | Status: DC | PRN
  Administered 2022-03-25: 160 mg via INTRAVENOUS

## 2022-03-25 MED ORDER — CHLORHEXIDINE GLUCONATE 0.12 % MT SOLN
OROMUCOSAL | Status: AC
  Administered 2022-03-25: 15 mL via OROMUCOSAL
  Filled 2022-03-25: qty 15

## 2022-03-25 MED ORDER — CHLORHEXIDINE GLUCONATE CLOTH 2 % EX PADS: 6.0000 | MEDICATED_PAD | Freq: Once | CUTANEOUS | Status: DC

## 2022-03-25 MED ORDER — HYDROMORPHONE HCL 1 MG/ML IJ SOLN: 0.2500 mg | INTRAMUSCULAR | Status: DC | PRN

## 2022-03-25 MED ORDER — OXYCODONE HCL 5 MG PO TABS: 5.0000 mg | ORAL_TABLET | ORAL | 0 refills | Status: DC | PRN

## 2022-03-25 MED ORDER — CEFAZOLIN SODIUM-DEXTROSE 2-4 GM/100ML-% IV SOLN
2.0000 g | INTRAVENOUS | Status: AC
  Administered 2022-03-25: 2 g via INTRAVENOUS

## 2022-03-25 MED ORDER — FENTANYL CITRATE (PF) 100 MCG/2ML IJ SOLN
INTRAMUSCULAR | Status: AC
  Filled 2022-03-25: qty 2

## 2022-03-25 MED ORDER — FENTANYL CITRATE (PF) 100 MCG/2ML IJ SOLN
INTRAMUSCULAR | Status: DC | PRN
  Administered 2022-03-25 (×2): 50 ug via INTRAVENOUS

## 2022-03-25 MED ORDER — CHLORHEXIDINE GLUCONATE 0.12 % MT SOLN: 15.0000 mL | Freq: Once | OROMUCOSAL | Status: AC

## 2022-03-25 MED ORDER — BUPIVACAINE-EPINEPHRINE (PF) 0.5% -1:200000 IJ SOLN
INTRAMUSCULAR | Status: AC
  Filled 2022-03-25: qty 30

## 2022-03-25 MED ORDER — DEXAMETHASONE SODIUM PHOSPHATE 10 MG/ML IJ SOLN
INTRAMUSCULAR | Status: DC | PRN
  Administered 2022-03-25: 10 mg via INTRAVENOUS

## 2022-03-25 MED ORDER — KETAMINE HCL 50 MG/5ML IJ SOSY
PREFILLED_SYRINGE | INTRAMUSCULAR | Status: AC
  Filled 2022-03-25: qty 5

## 2022-03-25 MED ORDER — GLYCOPYRROLATE 0.2 MG/ML IJ SOLN
INTRAMUSCULAR | Status: DC | PRN
  Administered 2022-03-25: .1 mg via INTRAVENOUS

## 2022-03-25 MED ORDER — CHLORHEXIDINE GLUCONATE CLOTH 2 % EX PADS
6.0000 | MEDICATED_PAD | Freq: Once | CUTANEOUS | Status: AC
  Administered 2022-03-25: 6 via TOPICAL

## 2022-03-25 MED ORDER — DEXAMETHASONE SODIUM PHOSPHATE 10 MG/ML IJ SOLN
INTRAMUSCULAR | Status: AC
  Filled 2022-03-25: qty 1

## 2022-03-25 MED ORDER — HYDROGEN PEROXIDE 3 % EX SOLN
CUTANEOUS | Status: DC | PRN
  Administered 2022-03-25: 1

## 2022-03-25 MED ORDER — BUPIVACAINE LIPOSOME 1.3 % IJ SUSP
INTRAMUSCULAR | Status: AC
  Filled 2022-03-25: qty 10

## 2022-03-25 MED ORDER — SUGAMMADEX SODIUM 200 MG/2ML IV SOLN
INTRAVENOUS | Status: DC | PRN
  Administered 2022-03-25: 200 mg via INTRAVENOUS

## 2022-03-25 MED ORDER — 0.9 % SODIUM CHLORIDE (POUR BTL) OPTIME
TOPICAL | Status: DC | PRN
  Administered 2022-03-25: 600 mL

## 2022-03-25 MED ORDER — ROCURONIUM BROMIDE 100 MG/10ML IV SOLN
INTRAVENOUS | Status: DC | PRN
  Administered 2022-03-25: 50 mg via INTRAVENOUS

## 2022-03-25 MED ORDER — PROMETHAZINE HCL 25 MG/ML IJ SOLN: 6.2500 mg | INTRAMUSCULAR | Status: DC | PRN

## 2022-03-25 MED ORDER — ACETAMINOPHEN 10 MG/ML IV SOLN: 1000.0000 mg | Freq: Once | INTRAVENOUS | Status: DC | PRN

## 2022-03-25 SURGICAL SUPPLY — 41 items
APL PRP STRL LF DISP 70% ISPRP (MISCELLANEOUS) ×1
BNDG GAUZE DERMACEA FLUFF 4 (GAUZE/BANDAGES/DRESSINGS) ×1 IMPLANT
BNDG GZE DERMACEA 4 6PLY (GAUZE/BANDAGES/DRESSINGS) ×1
CHLORAPREP W/TINT 26 (MISCELLANEOUS) ×1 IMPLANT
CNTNR SPEC 2.5X3XGRAD LEK (MISCELLANEOUS) ×1
CONT SPEC 4OZ STER OR WHT (MISCELLANEOUS) ×1
CONT SPEC 4OZ STRL OR WHT (MISCELLANEOUS) ×1
CONTAINER SPEC 2.5X3XGRAD LEK (MISCELLANEOUS) ×1 IMPLANT
DRAIN PENROSE 12X.25 LTX STRL (MISCELLANEOUS) ×1 IMPLANT
DRAPE LAPAROTOMY 77X122 PED (DRAPES) ×1 IMPLANT
DRSG GAUZE FLUFF 36X18 (GAUZE/BANDAGES/DRESSINGS) ×1 IMPLANT
ELECT CAUTERY BLADE TIP 2.5 (TIP) ×1
ELECT REM PT RETURN 9FT ADLT (ELECTROSURGICAL) ×1
ELECTRODE CAUTERY BLDE TIP 2.5 (TIP) ×1 IMPLANT
ELECTRODE REM PT RTRN 9FT ADLT (ELECTROSURGICAL) ×1 IMPLANT
GAUZE 4X4 16PLY ~~LOC~~+RFID DBL (SPONGE) ×1 IMPLANT
GAUZE SPONGE 4X4 12PLY STRL (GAUZE/BANDAGES/DRESSINGS) ×1 IMPLANT
GLOVE SURG SYN 7.0 (GLOVE) ×1 IMPLANT
GLOVE SURG SYN 7.0 PF PI (GLOVE) ×1 IMPLANT
GLOVE SURG SYN 7.5  E (GLOVE) ×1
GLOVE SURG SYN 7.5 E (GLOVE) ×1 IMPLANT
GLOVE SURG SYN 7.5 PF PI (GLOVE) ×1 IMPLANT
GOWN STRL REUS W/ TWL LRG LVL3 (GOWN DISPOSABLE) ×2 IMPLANT
GOWN STRL REUS W/TWL LRG LVL3 (GOWN DISPOSABLE) ×2
HYDROGEN PEROXIDE 16OZ (MISCELLANEOUS) IMPLANT
KIT TURNOVER KIT A (KITS) ×1 IMPLANT
LABEL OR SOLS (LABEL) ×1 IMPLANT
MANIFOLD NEPTUNE II (INSTRUMENTS) ×1 IMPLANT
NEEDLE HYPO 22GX1.5 SAFETY (NEEDLE) ×1 IMPLANT
NS IRRIG 500ML POUR BTL (IV SOLUTION) ×1 IMPLANT
PACK BASIN MINOR ARMC (MISCELLANEOUS) ×1 IMPLANT
PAD ABD DERMACEA PRESS 5X9 (GAUZE/BANDAGES/DRESSINGS) ×1 IMPLANT
SOL PREP PVP 2OZ (MISCELLANEOUS) ×1
SOLUTION PREP PVP 2OZ (MISCELLANEOUS) ×1 IMPLANT
SPONGE T-LAP 18X18 ~~LOC~~+RFID (SPONGE) ×2 IMPLANT
SUT SILK 0 CT 1 30 (SUTURE) IMPLANT
SWAB CULTURE AMIES ANAERIB BLU (MISCELLANEOUS) ×2 IMPLANT
SYR 20ML LL LF (SYRINGE) ×1 IMPLANT
SYR BULB IRRIG 60ML STRL (SYRINGE) ×1 IMPLANT
TRAP FLUID SMOKE EVACUATOR (MISCELLANEOUS) ×1 IMPLANT
WATER STERILE IRR 500ML POUR (IV SOLUTION) ×1 IMPLANT

## 2022-03-25 NOTE — Op Note (Signed)
  Procedure Date:  03/25/2022  Pre-operative Diagnosis:  Left buttocks hidradenitis with abscess  Post-operative Diagnosis: Left buttocks hidradenitis with abscess  Procedure:  Incision and Drainage of left buttocks hidradenitis with abscess, 5 total incisions.  Surgeon:  Melvyn Neth, MD  Anesthesia:  General endotracheal  Estimated Blood Loss:  20 ml  Specimens:  Culture swab  Complications:  None  Findings:  The patient had three main areas of hidradenitis clusters in the left medial buttocks, which communicated together with tunnels.  Five incisions were done at sites were he had drainage, and three 1/4 inch penrose drains were looped between the incisions.  Indications for Procedure:  This is a 56 y.o. male with diagnosis of left buttocks hidradenitis with abscess, requiring drainage procedure.  This is a chronic issue, and he's had this since at least January 2023 as seen on a PET/CT.  He is on chemotherapy and antibiotics alone are unlikely to help resolve this.  The risks of bleeding, abscess or infection, injury to surrounding structures, and need for further procedures were all discussed with the patient and was willing to proceed.  Description of Procedure: The patient was correctly identified in the preoperative area and brought into the operating room.  The patient was placed supine with VTE prophylaxis in place.  Appropriate time-outs were performed.  Anesthesia was induced and the patient was intubated.  Appropriate antibiotics were infused.  The patient was then placed in prone jackknife position  The patient's left buttocks was evaluated with ultrasound, and he was noted to have multiple small pockets of hidradenitis, and it appeared that there was one tunnel communicating these different areas.  The patient's buttocks were prepped and draped in usual sterile fashion.  Externally, he had three main areas that were draining.  Superiorly, started probing the wound with a  lacrimal probe and found one cavity, which had two draining points.  Culture swab was inserted to get fluid for culture.  Both were probed and opened wider using cautery.  Then, a curette was used to scrape with inside lining.  Probing again showed communication of this cavity with the next draining area in the middle portion of the buttocks.  This was probed further and again tunneled further inferiorly towards two adjacent draining areas.  These were probed as well and no further wounds were noted.  All areas were made wider with cautery and scraped with curette.  After this, it was confirmed that all the 5 areas communicated together.  The cavities were irrigated first with saline and then with hydrogen peroxide.  Local anesthetic was infused intradermally.  Then, a total of three 1/4 inch penrose drains segments were looped between the incisions and tied using 0 Silk ties.  Then the skin was cleansed and dressed with 4x4 gauze, followed by ABD pad, secured with tape.  The patient was then placed back in supine position, emerged from anesthesia, extubated, and brought to the recovery room for further management.  The patient tolerated the procedure well and all counts were correct at the end of the case.   Melvyn Neth, MD

## 2022-03-25 NOTE — Anesthesia Procedure Notes (Signed)
Procedure Name: Intubation Date/Time: 03/25/2022 11:49 AM  Performed by: Cammie Sickle, CRNAPre-anesthesia Checklist: Patient identified, Patient being monitored, Timeout performed, Emergency Drugs available and Suction available Patient Re-evaluated:Patient Re-evaluated prior to induction Oxygen Delivery Method: Circle system utilized Preoxygenation: Pre-oxygenation with 100% oxygen Induction Type: IV induction Ventilation: Mask ventilation without difficulty Laryngoscope Size: 3 and McGraph Grade View: Grade I Tube type: Oral Tube size: 7.0 mm Number of attempts: 1 Airway Equipment and Method: Stylet Placement Confirmation: ETT inserted through vocal cords under direct vision, positive ETCO2 and breath sounds checked- equal and bilateral Secured at: 22 cm Tube secured with: Tape Dental Injury: Teeth and Oropharynx as per pre-operative assessment

## 2022-03-25 NOTE — Discharge Instructions (Signed)

## 2022-03-25 NOTE — Transfer of Care (Signed)
Immediate Anesthesia Transfer of Care Note  Patient: Ryan Cannon  Procedure(s) Performed: INCISION AND DRAINAGE ABSCESS, buttocks/hidradenitis (Left: Buttocks)  Patient Location: PACU  Anesthesia Type:General  Level of Consciousness: drowsy  Airway & Oxygen Therapy: Patient Spontanous Breathing and Patient connected to face mask oxygen  Post-op Assessment: Report given to RN and Post -op Vital signs reviewed and stable  Post vital signs: Reviewed and stable  Last Vitals:  Vitals Value Taken Time  BP 125/74 03/25/22 1316  Temp 36.1 1316  Pulse 80 03/25/22 1320  Resp 20 03/25/22 1320  SpO2 97 % 03/25/22 1320  Vitals shown include unvalidated device data.  Last Pain:  Vitals:   03/25/22 1016  TempSrc: Temporal  PainSc: 7          Complications: No notable events documented.

## 2022-03-25 NOTE — Interval H&P Note (Signed)
History and Physical Interval Note:  03/25/2022 11:23 AM  Ryan Cannon  has presented today for surgery, with the diagnosis of Left buttocks abscess L02.31.  The various methods of treatment have been discussed with the patient and family. After consideration of risks, benefits and other options for treatment, the patient has consented to  Procedure(s): INCISION AND DRAINAGE ABSCESS, buttocks/hidradenitis (Left) as a surgical intervention.  The patient's history has been reviewed, patient examined, no change in status, stable for surgery.  I have reviewed the patient's chart and labs.  Questions were answered to the patient's satisfaction.     [name]

## 2022-03-26 ENCOUNTER — Encounter: Payer: Self-pay | Admitting: Surgery

## 2022-03-26 NOTE — Anesthesia Postprocedure Evaluation (Signed)
Anesthesia Post Note  Patient: Ryan Cannon  Procedure(s) Performed: INCISION AND DRAINAGE ABSCESS, buttocks/hidradenitis (Left: Buttocks)  Patient location during evaluation: PACU Anesthesia Type: General Level of consciousness: awake and alert Pain management: pain level controlled Vital Signs Assessment: post-procedure vital signs reviewed and stable Respiratory status: spontaneous breathing, nonlabored ventilation and respiratory function stable Cardiovascular status: blood pressure returned to baseline and stable Postop Assessment: no apparent nausea or vomiting Anesthetic complications: no   No notable events documented.   Last Vitals:  Vitals:   03/25/22 1345 03/25/22 1409  BP: (!) 145/89 (!) 156/95  Pulse: 79 67  Resp: 17 18  Temp: 36.4 C (!) 36.2 C  SpO2: 96% 97%    Last Pain:  Vitals:   03/26/22 0812  TempSrc:   PainSc: 0-No pain                 Iran Ouch

## 2022-03-30 LAB — AEROBIC/ANAEROBIC CULTURE W GRAM STAIN (SURGICAL/DEEP WOUND)

## 2022-03-31 ENCOUNTER — Ambulatory Visit: Payer: Self-pay | Admitting: Surgery

## 2022-04-03 ENCOUNTER — Encounter: Payer: Medicaid Other | Admitting: Physician Assistant

## 2022-04-03 ENCOUNTER — Telehealth: Payer: Self-pay

## 2022-04-03 ENCOUNTER — Inpatient Hospital Stay: Payer: Medicaid Other

## 2022-04-03 ENCOUNTER — Other Ambulatory Visit: Payer: Self-pay | Admitting: Internal Medicine

## 2022-04-03 MED FILL — Dexamethasone Sodium Phosphate Inj 100 MG/10ML: INTRAMUSCULAR | Qty: 1 | Status: AC

## 2022-04-03 NOTE — Telephone Encounter (Signed)
Left message for patient to return call- Thedore Mins is unable to see patient today and has offered to see patient tomorrow.

## 2022-04-04 ENCOUNTER — Encounter: Payer: Self-pay | Admitting: Physician Assistant

## 2022-04-04 ENCOUNTER — Inpatient Hospital Stay: Payer: Medicaid Other | Admitting: Internal Medicine

## 2022-04-04 ENCOUNTER — Inpatient Hospital Stay: Payer: Medicaid Other | Attending: Internal Medicine

## 2022-04-04 ENCOUNTER — Inpatient Hospital Stay: Payer: Medicaid Other

## 2022-04-04 ENCOUNTER — Ambulatory Visit (INDEPENDENT_AMBULATORY_CARE_PROVIDER_SITE_OTHER): Payer: Medicaid Other | Admitting: Physician Assistant

## 2022-04-04 VITALS — BP 140/83 | HR 89 | Temp 98.3°F | Ht 69.0 in | Wt 160.0 lb

## 2022-04-04 DIAGNOSIS — Z09 Encounter for follow-up examination after completed treatment for conditions other than malignant neoplasm: Secondary | ICD-10-CM

## 2022-04-04 DIAGNOSIS — L0231 Cutaneous abscess of buttock: Secondary | ICD-10-CM

## 2022-04-04 DIAGNOSIS — L732 Hidradenitis suppurativa: Secondary | ICD-10-CM

## 2022-04-04 NOTE — Progress Notes (Signed)
[facility] POST-OP OFFICE VISIT  04/04/2022  HPI: JASMINE MACEACHERN is a 56 y.o. male 10 days s/p Incision and Drainage of left buttocks hidradenitis with abscess with Dr Hampton Abbot  He is doing well Still having soreness; suspects this is from the drains and wants them all out Drainage present still; serous in nature No fever, chills No other complaints   Vital signs: BP (!) 140/83   Pulse 89   Temp 98.3 F (36.8 C)   Ht 5\' 9"  (1.753 m)   Wt 160 lb (72.6 kg)   SpO2 95%   BMI 23.63 kg/m    Physical Exam: Constitutional: Well appearing male, NAD Skin: Levada Dy present as chaperone, 5 incisions to the left gluteal fold with 3 penrose drains connecting these. The surrounding tissue is indurated. All drains removed at patient request without issue. No expressible drainage after removal. No evidence of undrained collections.   Assessment/Plan: This is a 56 y.o. male 10 days s/p Incision and Drainage of left buttocks hidradenitis with abscess with Dr Hampton Abbot   - Explained typical plan for sequential removal of drains in this setting weekly; however, patient is adamant that I remove all three today as he feels this is the source of all his discomfort. All drains removed without issue - He should continue local wound care and superficial dressings daily + as needed. Supplies were given   - Pain control prn  - I will see him again in ~2 weeks to ensure wounds continue to heal prior to resuming chemotherapy; He understands to call with questions/concerns in the interim.  -- Edison Simon, PA-C [facility] 04/04/2022, 10:21 AM M-F: 7am - 4pm

## 2022-04-04 NOTE — Patient Instructions (Signed)
If you have any concerns or questions, please feel free to call our office. See follow up appointment below.    Hidradenitis Suppurativa Hidradenitis suppurativa is a long-term (chronic) skin disease. It is similar to a severe form of acne, but it affects areas of the body where acne would be unusual, especially areas of the body where skin rubs against skin and becomes moist. These include: Underarms. Groin. Genital area. Buttocks. Upper thighs. Breasts. Hidradenitis suppurativa may start out as small lumps or pimples caused by blocked skin pores, sweat glands, or hair follicles. Pimples may develop into deep sores that break open (rupture) and drain pus. Over time, affected areas of skin may thicken and become scarred. This condition is rare and does not spread from person to person (non-contagious). What are the causes? The exact cause of this condition is not known. It may be related to: Male and male hormones. An overactive disease-fighting system (immune system). The immune system may over-react to blocked hair follicles or sweat glands and cause swelling and pus-filled sores. What increases the risk? You are more likely to develop this condition if you: Are male. Are 24-37 years old. Have a family history of hidradenitis suppurativa. Have a personal history of acne. Are overweight. Smoke. Take the medicine lithium. What are the signs or symptoms? The first symptoms are usually painful bumps in the skin, similar to pimples. The condition may get worse over time (progress), or it may only cause mild symptoms. If the disease progresses, symptoms may include: Skin bumps getting bigger and growing deeper into the skin. Bumps rupturing and draining pus. Itchy, infected skin. Skin getting thicker and scarred. Tunnels under the skin (fistulas) where pus drains from a bump. Pain during daily activities, such as pain during walking if your groin area is affected. Emotional problems,  such as stress or depression. This condition may affect your appearance and your ability or willingness to wear certain clothes or do certain activities. How is this diagnosed? This condition is diagnosed by a health care provider who specializes in skin conditions (dermatologist). You may be diagnosed based on: Your symptoms and medical history. A physical exam. Testing a pus sample for infection. Blood tests. How is this treated? Your treatment will depend on how severe your symptoms are. The same treatment will not work for everybody with this condition. You may need to try several treatments to find what works best for you. Treatment may include: Cleaning and bandaging (dressing) your wounds as needed. Lifestyle changes, such as new skin care routines. Taking medicines, such as: Antibiotics. Acne medicines. Medicines to reduce the activity of the immune system. A diabetes medicine (metformin). Birth control pills, for women. Steroids to reduce swelling and pain. Working with a mental health care provider, if you experience emotional distress due to this condition. If you have severe symptoms that do not get better with medicine, you may need surgery. Surgery may involve: Using a laser to clear the skin and remove hair follicles. Opening and draining deep sores. Removing the areas of skin that are diseased and scarred. Follow these instructions at home: Medicines  Take over-the-counter and prescription medicines only as told by your health care provider. If you were prescribed antibiotics, take them as told by your health care provider. Do not stop using the antibiotic even if your condition improves. Skin care If you have open wounds, cover them with a clean dressing as told by your health care provider. Keep wounds clean by washing them gently with  soap and water when you bathe. Do not shave the areas where you get hidradenitis suppurativa. Wear loose-fitting clothes. Try to  avoid getting overheated or sweaty. If you get sweaty or wet, change into clean, dry clothes as soon as you can. To help relieve pain and itchiness, cover sore areas with a warm, clean washcloth (warm compress) for 5-10 minutes as often as needed. Your healthcare provider may recommend an antiperspirant deodorant that may be gentle on your skin. A daily antiseptic wash to cleanse affected areas may be suggested by your healthcare provider. General instructions Learn as much as you can about your disease so that you have an active role in your treatment. Work closely with your health care provider to find treatments that work for you. If you are overweight, work with your health care provider to lose weight as recommended. Do not use any products that contain nicotine or tobacco. These products include cigarettes, chewing tobacco, and vaping devices, such as e-cigarettes. If you need help quitting, ask your health care provider. If you struggle with living with this condition, talk with your health care provider or work with a mental health care provider as recommended. Keep all follow-up visits. Where to find more information Hidradenitis [place].: www.hs-foundation.org American Academy of Dermatology: http://jones-macias.info/ Contact a health care provider if: You have a flare-up of hidradenitis suppurativa. You have a fever or chills. You have trouble controlling your symptoms at home. You have trouble doing your daily activities because of your symptoms. You have trouble dealing with emotional problems related to your condition. Summary Hidradenitis suppurativa is a long-term (chronic) skin disease. It is similar to a severe form of acne, but it affects areas of the body where acne would be unusual. The first symptoms are usually painful bumps in the skin, similar to pimples. The condition may only cause mild symptoms, or it may get worse over time (progress). If you have open wounds,  cover them with a clean dressing as told by your health care provider. Keep wounds clean by washing them gently with soap and water when you bathe. Besides skin care, treatment may include medicines, laser treatment, and surgery. This information is not intended to replace advice given to you by your health care provider. Make sure you discuss any questions you have with your health care provider. Document Revised: 08/28/2021 Document Reviewed: 08/28/2021 Elsevier Patient Education  [place].

## 2022-04-08 ENCOUNTER — Other Ambulatory Visit: Payer: Self-pay | Admitting: *Deleted

## 2022-04-08 MED ORDER — OXYCODONE HCL 10 MG PO TABS: 10.0000 mg | ORAL_TABLET | Freq: Four times a day (QID) | ORAL | 0 refills | Status: DC | PRN

## 2022-04-17 ENCOUNTER — Encounter: Payer: Self-pay | Admitting: Physician Assistant

## 2022-04-17 ENCOUNTER — Inpatient Hospital Stay: Payer: Medicaid Other

## 2022-04-17 ENCOUNTER — Ambulatory Visit (INDEPENDENT_AMBULATORY_CARE_PROVIDER_SITE_OTHER): Payer: Medicaid Other | Admitting: Physician Assistant

## 2022-04-17 VITALS — BP 141/92 | HR 90 | Temp 97.9°F | Wt 167.0 lb

## 2022-04-17 DIAGNOSIS — Z09 Encounter for follow-up examination after completed treatment for conditions other than malignant neoplasm: Secondary | ICD-10-CM | POA: Diagnosis not present

## 2022-04-17 DIAGNOSIS — L732 Hidradenitis suppurativa: Secondary | ICD-10-CM | POA: Diagnosis not present

## 2022-04-17 DIAGNOSIS — L0231 Cutaneous abscess of buttock: Secondary | ICD-10-CM

## 2022-04-17 NOTE — Patient Instructions (Signed)
If you have any concerns or questions, please feel free to call our office.   Hidradenitis Suppurativa Hidradenitis suppurativa is a long-term (chronic) skin disease. It is similar to a severe form of acne, but it affects areas of the body where acne would be unusual, especially areas of the body where skin rubs against skin and becomes moist. These include: Underarms. Groin. Genital area. Buttocks. Upper thighs. Breasts. Hidradenitis suppurativa may start out as small lumps or pimples caused by blocked skin pores, sweat glands, or hair follicles. Pimples may develop into deep sores that break open (rupture) and drain pus. Over time, affected areas of skin may thicken and become scarred. This condition is rare and does not spread from person to person (non-contagious). What are the causes? The exact cause of this condition is not known. It may be related to: Male and male hormones. An overactive disease-fighting system (immune system). The immune system may over-react to blocked hair follicles or sweat glands and cause swelling and pus-filled sores. What increases the risk? You are more likely to develop this condition if you: Are male. Are 24-50 years old. Have a family history of hidradenitis suppurativa. Have a personal history of acne. Are overweight. Smoke. Take the medicine lithium. What are the signs or symptoms? The first symptoms are usually painful bumps in the skin, similar to pimples. The condition may get worse over time (progress), or it may only cause mild symptoms. If the disease progresses, symptoms may include: Skin bumps getting bigger and growing deeper into the skin. Bumps rupturing and draining pus. Itchy, infected skin. Skin getting thicker and scarred. Tunnels under the skin (fistulas) where pus drains from a bump. Pain during daily activities, such as pain during walking if your groin area is affected. Emotional problems, such as stress or depression. This  condition may affect your appearance and your ability or willingness to wear certain clothes or do certain activities. How is this diagnosed? This condition is diagnosed by a health care provider who specializes in skin conditions (dermatologist). You may be diagnosed based on: Your symptoms and medical history. A physical exam. Testing a pus sample for infection. Blood tests. How is this treated? Your treatment will depend on how severe your symptoms are. The same treatment will not work for everybody with this condition. You may need to try several treatments to find what works best for you. Treatment may include: Cleaning and bandaging (dressing) your wounds as needed. Lifestyle changes, such as new skin care routines. Taking medicines, such as: Antibiotics. Acne medicines. Medicines to reduce the activity of the immune system. A diabetes medicine (metformin). Birth control pills, for women. Steroids to reduce swelling and pain. Working with a mental health care provider, if you experience emotional distress due to this condition. If you have severe symptoms that do not get better with medicine, you may need surgery. Surgery may involve: Using a laser to clear the skin and remove hair follicles. Opening and draining deep sores. Removing the areas of skin that are diseased and scarred. Follow these instructions at home: Medicines  Take over-the-counter and prescription medicines only as told by your health care provider. If you were prescribed antibiotics, take them as told by your health care provider. Do not stop using the antibiotic even if your condition improves. Skin care If you have open wounds, cover them with a clean dressing as told by your health care provider. Keep wounds clean by washing them gently with soap and water when you bathe.  Do not shave the areas where you get hidradenitis suppurativa. Wear loose-fitting clothes. Try to avoid getting overheated or sweaty. If  you get sweaty or wet, change into clean, dry clothes as soon as you can. To help relieve pain and itchiness, cover sore areas with a warm, clean washcloth (warm compress) for 5-10 minutes as often as needed. Your healthcare provider may recommend an antiperspirant deodorant that may be gentle on your skin. A daily antiseptic wash to cleanse affected areas may be suggested by your healthcare provider. General instructions Learn as much as you can about your disease so that you have an active role in your treatment. Work closely with your health care provider to find treatments that work for you. If you are overweight, work with your health care provider to lose weight as recommended. Do not use any products that contain nicotine or tobacco. These products include cigarettes, chewing tobacco, and vaping devices, such as e-cigarettes. If you need help quitting, ask your health care provider. If you struggle with living with this condition, talk with your health care provider or work with a mental health care provider as recommended. Keep all follow-up visits. Where to find more information Hidradenitis [place].: www.hs-foundation.org American Academy of Dermatology: http://jones-macias.info/ Contact a health care provider if: You have a flare-up of hidradenitis suppurativa. You have a fever or chills. You have trouble controlling your symptoms at home. You have trouble doing your daily activities because of your symptoms. You have trouble dealing with emotional problems related to your condition. Summary Hidradenitis suppurativa is a long-term (chronic) skin disease. It is similar to a severe form of acne, but it affects areas of the body where acne would be unusual. The first symptoms are usually painful bumps in the skin, similar to pimples. The condition may only cause mild symptoms, or it may get worse over time (progress). If you have open wounds, cover them with a clean dressing as  told by your health care provider. Keep wounds clean by washing them gently with soap and water when you bathe. Besides skin care, treatment may include medicines, laser treatment, and surgery. This information is not intended to replace advice given to you by your health care provider. Make sure you discuss any questions you have with your health care provider. Document Revised: 08/28/2021 Document Reviewed: 08/28/2021 Elsevier Patient Education  [place].

## 2022-04-17 NOTE — Progress Notes (Signed)
[facility] POST-OP OFFICE VISIT  04/17/2022  HPI: Ryan Cannon is a 56 y.o. male 23 days s/p Incision and Drainage of left buttocks hidradenitis with abscess with Dr Hampton Abbot  He is doing much better He denied any pain Still with scant amount of drainage No fever, chills Still doing superficial dressings  Vital signs: BP (!) 141/92   Pulse 90   Temp 97.9 F (36.6 C) (Oral)   Wt 167 lb (75.8 kg)   SpO2 98%   BMI 24.66 kg/m    Physical Exam: Constitutional: Well appearing male, NAD Skin: Levada Dy present as chaperone, 5 incisions to the left gluteal fold; these are closing appropriately. The surrounding tissue is indurated. No evidence of undrained collections.   Assessment/Plan: This is a 56 y.o. male 23 days s/p Incision and Drainage of left buttocks hidradenitis with abscess with Dr Hampton Abbot   - Reviewed wound care recommendation; superficial dressings as needed  - No further need for procedures nor Abx  - He can follow up on as needed basis; He understands to call with questions/concerns  -- Edison Simon, PA-C [facility] 04/17/2022, 2:28 PM M-F: 7am - 4pm

## 2022-04-21 ENCOUNTER — Other Ambulatory Visit: Payer: Self-pay | Admitting: *Deleted

## 2022-04-21 MED ORDER — OXYCODONE HCL 10 MG PO TABS: 10.0000 mg | ORAL_TABLET | Freq: Four times a day (QID) | ORAL | 0 refills | Status: DC | PRN

## 2022-04-24 MED FILL — Dexamethasone Sodium Phosphate Inj 100 MG/10ML: INTRAMUSCULAR | Qty: 1 | Status: AC

## 2022-04-25 ENCOUNTER — Encounter: Payer: Self-pay | Admitting: Internal Medicine

## 2022-04-25 ENCOUNTER — Inpatient Hospital Stay: Payer: Medicaid Other

## 2022-04-25 ENCOUNTER — Inpatient Hospital Stay: Payer: Medicaid Other | Attending: Internal Medicine

## 2022-04-25 ENCOUNTER — Inpatient Hospital Stay (HOSPITAL_BASED_OUTPATIENT_CLINIC_OR_DEPARTMENT_OTHER): Payer: Medicaid Other | Admitting: Internal Medicine

## 2022-04-25 VITALS — BP 135/88 | HR 87 | Temp 96.4°F | Resp 18 | Ht 69.0 in | Wt 167.4 lb

## 2022-04-25 DIAGNOSIS — Z79899 Other long term (current) drug therapy: Secondary | ICD-10-CM | POA: Diagnosis not present

## 2022-04-25 DIAGNOSIS — C3490 Malignant neoplasm of unspecified part of unspecified bronchus or lung: Secondary | ICD-10-CM | POA: Diagnosis not present

## 2022-04-25 DIAGNOSIS — Z5111 Encounter for antineoplastic chemotherapy: Secondary | ICD-10-CM | POA: Insufficient documentation

## 2022-04-25 DIAGNOSIS — C7931 Secondary malignant neoplasm of brain: Secondary | ICD-10-CM | POA: Diagnosis not present

## 2022-04-25 DIAGNOSIS — Z87891 Personal history of nicotine dependence: Secondary | ICD-10-CM | POA: Insufficient documentation

## 2022-04-25 DIAGNOSIS — Z5112 Encounter for antineoplastic immunotherapy: Secondary | ICD-10-CM | POA: Insufficient documentation

## 2022-04-25 DIAGNOSIS — C3412 Malignant neoplasm of upper lobe, left bronchus or lung: Secondary | ICD-10-CM | POA: Diagnosis not present

## 2022-04-25 DIAGNOSIS — C787 Secondary malignant neoplasm of liver and intrahepatic bile duct: Secondary | ICD-10-CM | POA: Diagnosis not present

## 2022-04-25 DIAGNOSIS — C7951 Secondary malignant neoplasm of bone: Secondary | ICD-10-CM | POA: Insufficient documentation

## 2022-04-25 LAB — CBC WITH DIFFERENTIAL/PLATELET
Abs Immature Granulocytes: 0.05 10*3/uL (ref 0.00–0.07)
Basophils Absolute: 0 10*3/uL (ref 0.0–0.1)
Basophils Relative: 0 %
Eosinophils Absolute: 0.1 10*3/uL (ref 0.0–0.5)
Eosinophils Relative: 1 %
HCT: 42.2 % (ref 39.0–52.0)
Hemoglobin: 13.6 g/dL (ref 13.0–17.0)
Immature Granulocytes: 1 %
Lymphocytes Relative: 12 %
Lymphs Abs: 1.3 10*3/uL (ref 0.7–4.0)
MCH: 31.1 pg (ref 26.0–34.0)
MCHC: 32.2 g/dL (ref 30.0–36.0)
MCV: 96.3 fL (ref 80.0–100.0)
Monocytes Absolute: 0.8 10*3/uL (ref 0.1–1.0)
Monocytes Relative: 7 %
Neutro Abs: 8.8 10*3/uL — ABNORMAL HIGH (ref 1.7–7.7)
Neutrophils Relative %: 79 %
Platelets: 257 10*3/uL (ref 150–400)
RBC: 4.38 MIL/uL (ref 4.22–5.81)
RDW: 14.4 % (ref 11.5–15.5)
WBC: 11 10*3/uL — ABNORMAL HIGH (ref 4.0–10.5)
nRBC: 0 % (ref 0.0–0.2)

## 2022-04-25 LAB — COMPREHENSIVE METABOLIC PANEL
ALT: 9 U/L (ref 0–44)
AST: 17 U/L (ref 15–41)
Albumin: 3.6 g/dL (ref 3.5–5.0)
Alkaline Phosphatase: 63 U/L (ref 38–126)
Anion gap: 8 (ref 5–15)
BUN: 13 mg/dL (ref 6–20)
CO2: 25 mmol/L (ref 22–32)
Calcium: 9.4 mg/dL (ref 8.9–10.3)
Chloride: 105 mmol/L (ref 98–111)
Creatinine, Ser: 1.01 mg/dL (ref 0.61–1.24)
GFR, Estimated: >60 mL/min (ref >60)
Glucose, Bld: 93 mg/dL (ref 70–99)
Potassium: 4.5 mmol/L (ref 3.5–5.1)
Sodium: 138 mmol/L (ref 135–145)
Total Bilirubin: 0.5 mg/dL (ref 0.3–1.2)
Total Protein: 8.2 g/dL — ABNORMAL HIGH (ref 6.5–8.1)

## 2022-04-25 LAB — URINALYSIS, DIPSTICK ONLY
Bilirubin Urine: NEGATIVE
Glucose, UA: NEGATIVE mg/dL
Hgb urine dipstick: NEGATIVE
Ketones, ur: NEGATIVE mg/dL
Leukocytes,Ua: NEGATIVE
Nitrite: NEGATIVE
Protein, ur: NEGATIVE mg/dL
Specific Gravity, Urine: 1.017 (ref 1.005–1.030)
pH: 5 (ref 5.0–8.0)

## 2022-04-25 MED ORDER — CYANOCOBALAMIN 1000 MCG/ML IJ SOLN
1000.0000 ug | Freq: Once | INTRAMUSCULAR | Status: AC
  Administered 2022-04-25: 1000 ug via INTRAMUSCULAR
  Filled 2022-04-25: qty 1

## 2022-04-25 MED ORDER — SODIUM CHLORIDE 0.9 % IV SOLN
500.0000 mg/m2 | Freq: Once | INTRAVENOUS | Status: AC
  Administered 2022-04-25: 1000 mg via INTRAVENOUS
  Filled 2022-04-25: qty 40

## 2022-04-25 MED ORDER — SODIUM CHLORIDE 0.9 % IV SOLN
15.0000 mg/kg | Freq: Once | INTRAVENOUS | Status: AC
  Administered 2022-04-25: 1100 mg via INTRAVENOUS
  Filled 2022-04-25: qty 32

## 2022-04-25 MED ORDER — SODIUM CHLORIDE 0.9 % IV SOLN
10.0000 mg | Freq: Once | INTRAVENOUS | Status: AC
  Administered 2022-04-25: 10 mg via INTRAVENOUS
  Filled 2022-04-25: qty 1
  Filled 2022-04-25: qty 10

## 2022-04-25 MED ORDER — SODIUM CHLORIDE 0.9 % IV SOLN
Freq: Once | INTRAVENOUS | Status: AC
  Filled 2022-04-25: qty 250

## 2022-04-25 MED ORDER — ACETAMINOPHEN 325 MG PO TABS
650.0000 mg | ORAL_TABLET | Freq: Once | ORAL | Status: AC
  Administered 2022-04-25: 650 mg via ORAL
  Filled 2022-04-25: qty 2

## 2022-04-25 NOTE — Progress Notes (Signed)
Patient denies new problems/concerns today.   °
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
## 2022-04-25 NOTE — Patient Instructions (Signed)
[facility]CER CTR AT [place]  Discharge Instructions: Thank you for choosing [facility] to provide your oncology and hematology care.  If you have a lab appointment with the [facility], please go directly to the [facility] and check in at the registration area.  Wear comfortable clothing and clothing appropriate for easy access to any Portacath or PICC line.   We strive to give you quality time with your provider. You may need to reschedule your appointment if you arrive late (15 or more minutes).  Arriving late affects you and other patients whose appointments are after yours.  Also, if you miss three or more appointments without notifying the office, you may be dismissed from the clinic at the provider's discretion.      For prescription refill requests, have your pharmacy contact our office and allow 72 hours for refills to be completed.    Today you received the following chemotherapy and/or immunotherapy agents: Keytruda, Alimta      To help prevent nausea and vomiting after your treatment, we encourage you to take your nausea medication as directed.  BELOW ARE SYMPTOMS THAT SHOULD BE REPORTED IMMEDIATELY: *FEVER GREATER THAN 100.4 F (38 C) OR HIGHER *CHILLS OR SWEATING *NAUSEA AND VOMITING THAT IS NOT CONTROLLED WITH YOUR NAUSEA MEDICATION *UNUSUAL SHORTNESS OF BREATH *UNUSUAL BRUISING OR BLEEDING *URINARY PROBLEMS (pain or burning when urinating, or frequent urination) *BOWEL PROBLEMS (unusual diarrhea, constipation, pain near the anus) TENDERNESS IN MOUTH AND THROAT WITH OR WITHOUT PRESENCE OF ULCERS (sore throat, sores in mouth, or a toothache) UNUSUAL RASH, SWELLING OR PAIN  UNUSUAL VAGINAL DISCHARGE OR ITCHING   Items with * indicate a potential emergency and should be followed up as soon as possible or go to the Emergency Department if any problems should occur.  Please show the CHEMOTHERAPY ALERT CARD or IMMUNOTHERAPY ALERT CARD at  check-in to the Emergency Department and triage nurse.  Should you have questions after your visit or need to cancel or reschedule your appointment, please contact [facility] CANCER [place] AT [place]  [phone] and follow the prompts.  Office hours are 8:00 a.m. to 4:30 p.m. Monday - Friday. Please note that voicemails left after 4:00 p.m. may not be returned until the following business day.  We are closed weekends and major holidays. You have access to a nurse at all times for urgent questions. Please call the main number to the clinic [phone] and follow the prompts.  For any non-urgent questions, you may also contact your provider using MyChart. We now offer e-Visits for anyone 46 and older to request care online for non-urgent symptoms. For details visit [email or web address].   Also download the MyChart app! Go to the app store, search "MyChart", open the app, select [facility], and log in with your MyChart username and password.  Masks are optional in the cancer centers. If you would like for your care team to wear a mask while they are taking care of you, please let them know. For doctor visits, patients may have with them one support person who is at least 58 years old. At this time, visitors are not allowed in the infusion area.

## 2022-04-29 ENCOUNTER — Ambulatory Visit: Payer: Medicaid Other | Attending: Pulmonary Disease

## 2022-05-05 ENCOUNTER — Other Ambulatory Visit: Payer: Self-pay | Admitting: *Deleted

## 2022-05-05 MED ORDER — OXYCODONE HCL 10 MG PO TABS: 10.0000 mg | ORAL_TABLET | Freq: Four times a day (QID) | ORAL | 0 refills | Status: DC | PRN

## 2022-05-09 ENCOUNTER — Ambulatory Visit: Payer: Medicaid Other | Admitting: Pulmonary Disease

## 2022-05-12 ENCOUNTER — Telehealth: Payer: Self-pay | Admitting: *Deleted

## 2022-05-12 ENCOUNTER — Ambulatory Visit: Admission: RE | Admit: 2022-05-12 | Payer: Medicaid Other | Source: Ambulatory Visit

## 2022-05-12 ENCOUNTER — Inpatient Hospital Stay: Payer: Medicaid Other

## 2022-05-12 DIAGNOSIS — C3412 Malignant neoplasm of upper lobe, left bronchus or lung: Secondary | ICD-10-CM

## 2022-05-12 DIAGNOSIS — R000 Tachycardia, unspecified: Secondary | ICD-10-CM

## 2022-05-12 NOTE — Telephone Encounter (Signed)
Pt states that at his last visit that he needed to get his "heart checked out." Does pt need a referral to cardiology?  Please advise.

## 2022-05-12 NOTE — Telephone Encounter (Signed)
Referral has been placed. Pt informed to expect call from their clinic with an appt.

## 2022-05-13 ENCOUNTER — Inpatient Hospital Stay (HOSPITAL_BASED_OUTPATIENT_CLINIC_OR_DEPARTMENT_OTHER): Payer: Medicaid Other | Admitting: Hospice and Palliative Medicine

## 2022-05-13 DIAGNOSIS — G893 Neoplasm related pain (acute) (chronic): Secondary | ICD-10-CM

## 2022-05-13 DIAGNOSIS — Z515 Encounter for palliative care: Secondary | ICD-10-CM | POA: Diagnosis not present

## 2022-05-13 DIAGNOSIS — C3412 Malignant neoplasm of upper lobe, left bronchus or lung: Secondary | ICD-10-CM | POA: Diagnosis not present

## 2022-05-13 MED ORDER — NALOXONE HCL 4 MG/0.1ML NA LIQD: NASAL | 0 refills | Status: DC

## 2022-05-13 MED ORDER — OXYCODONE HCL 10 MG PO TABS: 10.0000 mg | ORAL_TABLET | ORAL | 0 refills | Status: DC | PRN

## 2022-05-13 MED ORDER — FENTANYL 75 MCG/HR TD PT72: 1.0000 | MEDICATED_PATCH | TRANSDERMAL | 0 refills | Status: DC

## 2022-05-13 NOTE — Progress Notes (Signed)
Virtual Visit via Telephone Note  I connected with Ryan Cannon on 05/13/22 at 10:30 AM EDT by telephone and verified that I am speaking with the correct person using two identifiers.  Location: Patient: Home Provider: Clinic   I discussed the limitations, risks, security and privacy concerns of performing an evaluation and management service by telephone and the availability of in person appointments. I also discussed with the patient that there may be a patient responsible charge related to this service. The patient expressed understanding and agreed to proceed.   History of Present Illness: Ryan Cannon is a 56 y.o. male with multiple medical problems including multiple sclerosis and recently found to have probable stage IV lung cancer with metastasis to scapula and possible liver.  Patient has a lytic destructive lesion involving the inferior angle of the right scapula.  Patient has had severe pain and was referred to palliative care to help address goals and manage ongoing symptoms.    Observations/Objective: I called and spoke with patient by phone.    He reports persistent back pain and feels like his pain regimen has become less effective over time.  He says that he is using two 25 mcg fentanyl patches to equal 50 mcg dose.  Additionally, he says he is taking oxycodone 1 to 2 tablets at least twice a day but often more frequently.  He says that regimen was keeping the pain tolerable but no longer seems to be as effective.  He denies any adverse effects from pain medications.  I pointed out to the patient that we had not refilled the fentanyl in a couple of months and questioned his report of consistent dosing.  He says that he had many 25 mcg patches left and has just been using that supply. We discussed the importance of adhering strictly to the prescribed regimen.  Patient requests stronger pain regimen.  Discussed increasing dose of transdermal fentanyl to try to minimize frequent  breakthrough pain.   Assessment and Plan: Stage IV lung cancer -on systemic chemotherapy.    Neoplasm related pain -discussed with Dr. Rogue Bussing who has not identified any suspicious behaviors when patient is seen in clinic.  Agreed to increase fentanyl to 75 mcg to 72 hours with instructions for consistent dosing.  We will refill oxycodone for use as needed for breakthrough pain.  Follow Up Instructions: Follow-up telephone visit 1 month   I discussed the assessment and treatment plan with the patient. The patient was provided an opportunity to ask questions and all were answered. The patient agreed with the plan and demonstrated an understanding of the instructions.   The patient was advised to call back or seek an in-person evaluation if the symptoms worsen or if the condition fails to improve as anticipated.  I provided 5 minutes of non-face-to-face time during this encounter.   Irean Hong, NP

## 2022-05-15 ENCOUNTER — Telehealth: Payer: Self-pay | Admitting: *Deleted

## 2022-05-15 MED FILL — Dexamethasone Sodium Phosphate Inj 100 MG/10ML: INTRAMUSCULAR | Qty: 1 | Status: AC

## 2022-05-15 NOTE — Telephone Encounter (Signed)
PA for fentanyl 52mcg patch approved. Approval letter faxed to [facility]. Pt made aware.

## 2022-05-15 NOTE — Telephone Encounter (Signed)
Received call from pt that fentanyl patches will cost him over $100. Informed pt that most likely his prescription requires prior authorization for insurance to cover cost. PA submitted and informed pt will call him once approved. Pt verbalized understanding.

## 2022-05-16 ENCOUNTER — Inpatient Hospital Stay: Payer: Medicaid Other

## 2022-05-16 ENCOUNTER — Inpatient Hospital Stay (HOSPITAL_BASED_OUTPATIENT_CLINIC_OR_DEPARTMENT_OTHER): Payer: Medicaid Other | Admitting: Internal Medicine

## 2022-05-16 ENCOUNTER — Inpatient Hospital Stay: Payer: Medicaid Other | Attending: Internal Medicine

## 2022-05-16 ENCOUNTER — Encounter: Payer: Self-pay | Admitting: Internal Medicine

## 2022-05-16 VITALS — BP 127/94 | HR 78 | Temp 96.7°F | Resp 18 | Wt 162.0 lb

## 2022-05-16 DIAGNOSIS — Z79899 Other long term (current) drug therapy: Secondary | ICD-10-CM | POA: Insufficient documentation

## 2022-05-16 DIAGNOSIS — C3412 Malignant neoplasm of upper lobe, left bronchus or lung: Secondary | ICD-10-CM

## 2022-05-16 DIAGNOSIS — Z5111 Encounter for antineoplastic chemotherapy: Secondary | ICD-10-CM | POA: Insufficient documentation

## 2022-05-16 DIAGNOSIS — Z5112 Encounter for antineoplastic immunotherapy: Secondary | ICD-10-CM | POA: Diagnosis present

## 2022-05-16 LAB — CBC WITH DIFFERENTIAL/PLATELET
Abs Immature Granulocytes: 0.1 10*3/uL — ABNORMAL HIGH (ref 0.00–0.07)
Basophils Absolute: 0 10*3/uL (ref 0.0–0.1)
Basophils Relative: 0 %
Eosinophils Absolute: 0.1 10*3/uL (ref 0.0–0.5)
Eosinophils Relative: 1 %
HCT: 33.7 % — ABNORMAL LOW (ref 39.0–52.0)
Hemoglobin: 10.6 g/dL — ABNORMAL LOW (ref 13.0–17.0)
Immature Granulocytes: 1 %
Lymphocytes Relative: 11 %
Lymphs Abs: 1.6 10*3/uL (ref 0.7–4.0)
MCH: 31.5 pg (ref 26.0–34.0)
MCHC: 31.5 g/dL (ref 30.0–36.0)
MCV: 100.3 fL — ABNORMAL HIGH (ref 80.0–100.0)
Monocytes Absolute: 1.3 10*3/uL — ABNORMAL HIGH (ref 0.1–1.0)
Monocytes Relative: 9 %
Neutro Abs: 11.4 10*3/uL — ABNORMAL HIGH (ref 1.7–7.7)
Neutrophils Relative %: 78 %
Platelets: 437 10*3/uL — ABNORMAL HIGH (ref 150–400)
RBC: 3.36 MIL/uL — ABNORMAL LOW (ref 4.22–5.81)
RDW: 16.5 % — ABNORMAL HIGH (ref 11.5–15.5)
WBC: 14.5 10*3/uL — ABNORMAL HIGH (ref 4.0–10.5)
nRBC: 0 % (ref 0.0–0.2)

## 2022-05-16 LAB — COMPREHENSIVE METABOLIC PANEL
ALT: 9 U/L (ref 0–44)
AST: 21 U/L (ref 15–41)
Albumin: 3.5 g/dL (ref 3.5–5.0)
Alkaline Phosphatase: 69 U/L (ref 38–126)
Anion gap: 10 (ref 5–15)
BUN: 10 mg/dL (ref 6–20)
CO2: 25 mmol/L (ref 22–32)
Calcium: 9.3 mg/dL (ref 8.9–10.3)
Chloride: 103 mmol/L (ref 98–111)
Creatinine, Ser: 1.03 mg/dL (ref 0.61–1.24)
GFR, Estimated: >60 mL/min (ref >60)
Glucose, Bld: 103 mg/dL — ABNORMAL HIGH (ref 70–99)
Potassium: 3.9 mmol/L (ref 3.5–5.1)
Sodium: 138 mmol/L (ref 135–145)
Total Bilirubin: 0.3 mg/dL (ref 0.3–1.2)
Total Protein: 8.9 g/dL — ABNORMAL HIGH (ref 6.5–8.1)

## 2022-05-16 LAB — URINALYSIS, DIPSTICK ONLY
Bilirubin Urine: NEGATIVE
Glucose, UA: NEGATIVE mg/dL
Hgb urine dipstick: NEGATIVE
Ketones, ur: 5 mg/dL — AB
Leukocytes,Ua: NEGATIVE
Nitrite: NEGATIVE
Protein, ur: NEGATIVE mg/dL
Specific Gravity, Urine: 1.015 (ref 1.005–1.030)
pH: 5 (ref 5.0–8.0)

## 2022-05-16 MED ORDER — SODIUM CHLORIDE 0.9 % IV SOLN
15.0000 mg/kg | Freq: Once | INTRAVENOUS | Status: AC
  Administered 2022-05-16: 1100 mg via INTRAVENOUS
  Filled 2022-05-16: qty 32

## 2022-05-16 MED ORDER — CYANOCOBALAMIN 1000 MCG/ML IJ SOLN
1000.0000 ug | Freq: Once | INTRAMUSCULAR | Status: AC
  Administered 2022-05-16: 1000 ug via INTRAMUSCULAR
  Filled 2022-05-16: qty 1

## 2022-05-16 MED ORDER — SODIUM CHLORIDE 0.9 % IV SOLN
500.0000 mg/m2 | Freq: Once | INTRAVENOUS | Status: AC
  Administered 2022-05-16: 1000 mg via INTRAVENOUS
  Filled 2022-05-16: qty 40

## 2022-05-16 MED ORDER — ACETAMINOPHEN 325 MG PO TABS
650.0000 mg | ORAL_TABLET | Freq: Once | ORAL | Status: AC
  Administered 2022-05-16: 650 mg via ORAL
  Filled 2022-05-16: qty 2

## 2022-05-16 MED ORDER — SODIUM CHLORIDE 0.9 % IV SOLN
Freq: Once | INTRAVENOUS | Status: AC
  Filled 2022-05-16: qty 250

## 2022-05-16 MED ORDER — SODIUM CHLORIDE 0.9 % IV SOLN
10.0000 mg | Freq: Once | INTRAVENOUS | Status: AC
  Administered 2022-05-16: 10 mg via INTRAVENOUS
  Filled 2022-05-16: qty 10

## 2022-05-16 NOTE — Patient Instructions (Signed)
[facility] AT [place]  Discharge Instructions: Thank you for choosing [facility] to provide your oncology and hematology care.  If you have a lab appointment with the [facility], please go directly to the [facility] and check in at the registration area.  Wear comfortable clothing and clothing appropriate for easy access to any Portacath or PICC line.   We strive to give you quality time with your provider. You may need to reschedule your appointment if you arrive late (15 or more minutes).  Arriving late affects you and other patients whose appointments are after yours.  Also, if you miss three or more appointments without notifying the office, you may be dismissed from the clinic at the provider's discretion.      For prescription refill requests, have your pharmacy contact our office and allow 72 hours for refills to be completed.    Today you received the following chemotherapy and/or immunotherapy agents alimta, avastin    To help prevent nausea and vomiting after your treatment, we encourage you to take your nausea medication as directed.  BELOW ARE SYMPTOMS THAT SHOULD BE REPORTED IMMEDIATELY: *FEVER GREATER THAN 100.4 F (38 C) OR HIGHER *CHILLS OR SWEATING *NAUSEA AND VOMITING THAT IS NOT CONTROLLED WITH YOUR NAUSEA MEDICATION *UNUSUAL SHORTNESS OF BREATH *UNUSUAL BRUISING OR BLEEDING *URINARY PROBLEMS (pain or burning when urinating, or frequent urination) *BOWEL PROBLEMS (unusual diarrhea, constipation, pain near the anus) TENDERNESS IN MOUTH AND THROAT WITH OR WITHOUT PRESENCE OF ULCERS (sore throat, sores in mouth, or a toothache) UNUSUAL RASH, SWELLING OR PAIN  UNUSUAL VAGINAL DISCHARGE OR ITCHING   Items with * indicate a potential emergency and should be followed up as soon as possible or go to the Emergency Department if any problems should occur.  Please show the CHEMOTHERAPY ALERT CARD or IMMUNOTHERAPY ALERT CARD at check-in  to the Emergency Department and triage nurse.  Should you have questions after your visit or need to cancel or reschedule your appointment, please contact [facility] [place] AT [place]  [phone] and follow the prompts.  Office hours are 8:00 a.m. to 4:30 p.m. Monday - Friday. Please note that voicemails left after 4:00 p.m. may not be returned until the following business day.  We are closed weekends and major holidays. You have access to a nurse at all times for urgent questions. Please call the main number to the clinic [phone] and follow the prompts.  For any non-urgent questions, you may also contact your provider using MyChart. We now offer e-Visits for anyone 2 and older to request care online for non-urgent symptoms. For details visit [email or web address].   Also download the MyChart app! Go to the app store, search "MyChart", open the app, select , and log in with your MyChart username and password.  Masks are optional in the cancer centers. If you would like for your care team to wear a mask while they are taking care of you, please let them know. For doctor visits, patients may have with them one support person who is at least 58 years old. At this time, visitors are not allowed in the infusion area.
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
## 2022-05-16 NOTE — Progress Notes (Signed)
Patient is scheduled for cardiac evaluation  Mid back and right shoulder pain, 8/10 pain scale.  New constipation with last complete bowel movement this morning.    Reports good appetite that does decline for 2 days after treatment, 5 lb wt loss.  New increasing SOBr on exertion.  Concerned of skin discoloration on left arm.  BP 127/94, HR 78
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
## 2022-05-21 ENCOUNTER — Ambulatory Visit: Payer: Medicaid Other | Admitting: Internal Medicine

## 2022-05-21 ENCOUNTER — Inpatient Hospital Stay: Payer: Medicaid Other | Attending: Internal Medicine

## 2022-05-21 DIAGNOSIS — Z79899 Other long term (current) drug therapy: Secondary | ICD-10-CM | POA: Insufficient documentation

## 2022-05-21 DIAGNOSIS — Z5111 Encounter for antineoplastic chemotherapy: Secondary | ICD-10-CM | POA: Insufficient documentation

## 2022-05-21 DIAGNOSIS — Z5112 Encounter for antineoplastic immunotherapy: Secondary | ICD-10-CM | POA: Insufficient documentation

## 2022-05-21 DIAGNOSIS — C7951 Secondary malignant neoplasm of bone: Secondary | ICD-10-CM | POA: Insufficient documentation

## 2022-05-21 DIAGNOSIS — C3412 Malignant neoplasm of upper lobe, left bronchus or lung: Secondary | ICD-10-CM | POA: Insufficient documentation
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
## 2022-05-23 ENCOUNTER — Ambulatory Visit: Admission: RE | Admit: 2022-05-23 | Payer: Medicaid Other | Source: Ambulatory Visit

## 2022-05-23 ENCOUNTER — Inpatient Hospital Stay: Payer: Medicaid Other

## 2022-05-28 ENCOUNTER — Other Ambulatory Visit: Payer: Self-pay | Admitting: *Deleted

## 2022-05-28 MED ORDER — OXYCODONE HCL 10 MG PO TABS: 10.0000 mg | ORAL_TABLET | ORAL | 0 refills | Status: DC | PRN

## 2022-06-05 MED FILL — Dexamethasone Sodium Phosphate Inj 100 MG/10ML: INTRAMUSCULAR | Qty: 1 | Status: AC

## 2022-06-06 ENCOUNTER — Other Ambulatory Visit: Payer: Self-pay | Admitting: Oncology

## 2022-06-06 ENCOUNTER — Inpatient Hospital Stay (HOSPITAL_BASED_OUTPATIENT_CLINIC_OR_DEPARTMENT_OTHER): Payer: Medicaid Other | Admitting: Hospice and Palliative Medicine

## 2022-06-06 ENCOUNTER — Inpatient Hospital Stay: Payer: Medicaid Other

## 2022-06-06 ENCOUNTER — Telehealth: Payer: Self-pay

## 2022-06-06 ENCOUNTER — Inpatient Hospital Stay (HOSPITAL_BASED_OUTPATIENT_CLINIC_OR_DEPARTMENT_OTHER): Payer: Medicaid Other | Admitting: Oncology

## 2022-06-06 ENCOUNTER — Encounter: Payer: Self-pay | Admitting: Oncology

## 2022-06-06 VITALS — BP 128/82

## 2022-06-06 DIAGNOSIS — G893 Neoplasm related pain (acute) (chronic): Secondary | ICD-10-CM | POA: Diagnosis not present

## 2022-06-06 DIAGNOSIS — C7951 Secondary malignant neoplasm of bone: Secondary | ICD-10-CM | POA: Diagnosis not present

## 2022-06-06 DIAGNOSIS — Z5112 Encounter for antineoplastic immunotherapy: Secondary | ICD-10-CM | POA: Diagnosis present

## 2022-06-06 DIAGNOSIS — C3412 Malignant neoplasm of upper lobe, left bronchus or lung: Secondary | ICD-10-CM | POA: Diagnosis not present

## 2022-06-06 DIAGNOSIS — Z5111 Encounter for antineoplastic chemotherapy: Secondary | ICD-10-CM | POA: Diagnosis not present

## 2022-06-06 DIAGNOSIS — Z79899 Other long term (current) drug therapy: Secondary | ICD-10-CM | POA: Diagnosis not present

## 2022-06-06 LAB — CBC WITH DIFFERENTIAL/PLATELET
Abs Immature Granulocytes: 0.09 10*3/uL — ABNORMAL HIGH (ref 0.00–0.07)
Basophils Absolute: 0 10*3/uL (ref 0.0–0.1)
Basophils Relative: 0 %
Eosinophils Absolute: 0.1 10*3/uL (ref 0.0–0.5)
Eosinophils Relative: 0 %
HCT: 35.6 % — ABNORMAL LOW (ref 39.0–52.0)
Hemoglobin: 11.2 g/dL — ABNORMAL LOW (ref 13.0–17.0)
Immature Granulocytes: 1 %
Lymphocytes Relative: 10 %
Lymphs Abs: 1.5 10*3/uL (ref 0.7–4.0)
MCH: 31.3 pg (ref 26.0–34.0)
MCHC: 31.5 g/dL (ref 30.0–36.0)
MCV: 99.4 fL (ref 80.0–100.0)
Monocytes Absolute: 1.1 10*3/uL — ABNORMAL HIGH (ref 0.1–1.0)
Monocytes Relative: 8 %
Neutro Abs: 12.2 10*3/uL — ABNORMAL HIGH (ref 1.7–7.7)
Neutrophils Relative %: 81 %
Platelets: 641 10*3/uL — ABNORMAL HIGH (ref 150–400)
RBC: 3.58 MIL/uL — ABNORMAL LOW (ref 4.22–5.81)
RDW: 17.1 % — ABNORMAL HIGH (ref 11.5–15.5)
WBC: 15 10*3/uL — ABNORMAL HIGH (ref 4.0–10.5)
nRBC: 0 % (ref 0.0–0.2)

## 2022-06-06 LAB — URINALYSIS, DIPSTICK ONLY
Bilirubin Urine: NEGATIVE
Glucose, UA: NEGATIVE mg/dL
Hgb urine dipstick: NEGATIVE
Ketones, ur: NEGATIVE mg/dL
Leukocytes,Ua: NEGATIVE
Nitrite: NEGATIVE
Protein, ur: NEGATIVE mg/dL
Specific Gravity, Urine: 1.019 (ref 1.005–1.030)
pH: 5 (ref 5.0–8.0)

## 2022-06-06 LAB — COMPREHENSIVE METABOLIC PANEL
ALT: 10 U/L (ref 0–44)
AST: 21 U/L (ref 15–41)
Albumin: 3.1 g/dL — ABNORMAL LOW (ref 3.5–5.0)
Alkaline Phosphatase: 61 U/L (ref 38–126)
Anion gap: 10 (ref 5–15)
BUN: 8 mg/dL (ref 6–20)
CO2: 24 mmol/L (ref 22–32)
Calcium: 9 mg/dL (ref 8.9–10.3)
Chloride: 102 mmol/L (ref 98–111)
Creatinine, Ser: 0.87 mg/dL (ref 0.61–1.24)
GFR, Estimated: >60 mL/min (ref >60)
Glucose, Bld: 108 mg/dL — ABNORMAL HIGH (ref 70–99)
Potassium: 3.7 mmol/L (ref 3.5–5.1)
Sodium: 136 mmol/L (ref 135–145)
Total Bilirubin: 0.2 mg/dL — ABNORMAL LOW (ref 0.3–1.2)
Total Protein: 8.6 g/dL — ABNORMAL HIGH (ref 6.5–8.1)

## 2022-06-06 LAB — IRON AND TIBC
Iron: 45 ug/dL (ref 45–182)
Saturation Ratios: 17 % — ABNORMAL LOW (ref 17.9–39.5)
TIBC: 259 ug/dL (ref 250–450)
UIBC: 214 ug/dL

## 2022-06-06 LAB — FOLATE: Folate: 8.7 ng/mL (ref >5.9)

## 2022-06-06 LAB — FERRITIN: Ferritin: 132 ng/mL (ref 24–336)

## 2022-06-06 MED ORDER — OXYCODONE HCL 10 MG PO TABS: 10.0000 mg | ORAL_TABLET | ORAL | 0 refills | Status: DC | PRN

## 2022-06-06 MED ORDER — MORPHINE SULFATE ER 15 MG PO TBCR: 15.0000 mg | EXTENDED_RELEASE_TABLET | Freq: Two times a day (BID) | ORAL | 0 refills | Status: DC

## 2022-06-06 MED ORDER — SODIUM CHLORIDE 0.9 % IV SOLN
15.0000 mg/kg | Freq: Once | INTRAVENOUS | Status: AC
  Administered 2022-06-06: 1100 mg via INTRAVENOUS
  Filled 2022-06-06: qty 32

## 2022-06-06 MED ORDER — SODIUM CHLORIDE 0.9 % IV SOLN
500.0000 mg/m2 | Freq: Once | INTRAVENOUS | Status: AC
  Administered 2022-06-06: 1000 mg via INTRAVENOUS
  Filled 2022-06-06: qty 40

## 2022-06-06 MED ORDER — CYANOCOBALAMIN 1000 MCG/ML IJ SOLN
1000.0000 ug | Freq: Once | INTRAMUSCULAR | Status: AC
  Administered 2022-06-06: 1000 ug via INTRAMUSCULAR
  Filled 2022-06-06: qty 1

## 2022-06-06 MED ORDER — ACETAMINOPHEN 325 MG PO TABS
650.0000 mg | ORAL_TABLET | Freq: Once | ORAL | Status: AC
  Administered 2022-06-06: 650 mg via ORAL
  Filled 2022-06-06: qty 2

## 2022-06-06 MED ORDER — SODIUM CHLORIDE 0.9 % IV SOLN
10.0000 mg | Freq: Once | INTRAVENOUS | Status: AC
  Administered 2022-06-06: 10 mg via INTRAVENOUS
  Filled 2022-06-06: qty 10

## 2022-06-06 MED ORDER — SODIUM CHLORIDE 0.9 % IV SOLN
Freq: Once | INTRAVENOUS | Status: AC
  Filled 2022-06-06: qty 250

## 2022-06-06 NOTE — Patient Instructions (Signed)
[facility] AT [place]  Discharge Instructions: Thank you for choosing [facility] to provide your oncology and hematology care.  If you have a lab appointment with the [facility], please go directly to the [facility] and check in at the registration area.  Wear comfortable clothing and clothing appropriate for easy access to any Portacath or PICC line.   We strive to give you quality time with your provider. You may need to reschedule your appointment if you arrive late (15 or more minutes).  Arriving late affects you and other patients whose appointments are after yours.  Also, if you miss three or more appointments without notifying the office, you may be dismissed from the clinic at the provider's discretion.      For prescription refill requests, have your pharmacy contact our office and allow 72 hours for refills to be completed.    Today you received the following chemotherapy and/or immunotherapy agents alimta, avastin    To help prevent nausea and vomiting after your treatment, we encourage you to take your nausea medication as directed.  BELOW ARE SYMPTOMS THAT SHOULD BE REPORTED IMMEDIATELY: *FEVER GREATER THAN 100.4 F (38 C) OR HIGHER *CHILLS OR SWEATING *NAUSEA AND VOMITING THAT IS NOT CONTROLLED WITH YOUR NAUSEA MEDICATION *UNUSUAL SHORTNESS OF BREATH *UNUSUAL BRUISING OR BLEEDING *URINARY PROBLEMS (pain or burning when urinating, or frequent urination) *BOWEL PROBLEMS (unusual diarrhea, constipation, pain near the anus) TENDERNESS IN MOUTH AND THROAT WITH OR WITHOUT PRESENCE OF ULCERS (sore throat, sores in mouth, or a toothache) UNUSUAL RASH, SWELLING OR PAIN  UNUSUAL VAGINAL DISCHARGE OR ITCHING   Items with * indicate a potential emergency and should be followed up as soon as possible or go to the Emergency Department if any problems should occur.  Please show the CHEMOTHERAPY ALERT CARD or IMMUNOTHERAPY ALERT CARD at check-in  to the Emergency Department and triage nurse.  Should you have questions after your visit or need to cancel or reschedule your appointment, please contact [facility] [place] AT [place]  [phone] and follow the prompts.  Office hours are 8:00 a.m. to 4:30 p.m. Monday - Friday. Please note that voicemails left after 4:00 p.m. may not be returned until the following business day.  We are closed weekends and major holidays. You have access to a nurse at all times for urgent questions. Please call the main number to the clinic [phone] and follow the prompts.  For any non-urgent questions, you may also contact your provider using MyChart. We now offer e-Visits for anyone 81 and older to request care online for non-urgent symptoms. For details visit [email or web address].   Also download the MyChart app! Go to the app store, search "MyChart", open the app, select , and log in with your MyChart username and password.  Masks are optional in the cancer centers. If you would like for your care team to wear a mask while they are taking care of you, please let them know. For doctor visits, patients may have with them one support person who is at least 58 years old. At this time, visitors are not allowed in the infusion area.
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
## 2022-06-16 ENCOUNTER — Other Ambulatory Visit: Payer: Self-pay | Admitting: *Deleted

## 2022-06-16 ENCOUNTER — Telehealth: Payer: Medicaid Other | Admitting: Hospice and Palliative Medicine

## 2022-06-16 MED ORDER — OXYCODONE HCL 10 MG PO TABS: 10.0000 mg | ORAL_TABLET | ORAL | 0 refills | Status: DC | PRN

## 2022-06-16 NOTE — Telephone Encounter (Signed)
Pt requesting refill of oxycodone. States that he tried the MS Contin but it made him nauseated so he stopped taking them and continued with oxycodone.

## 2022-06-19 ENCOUNTER — Other Ambulatory Visit: Payer: Medicaid Other

## 2022-06-19 ENCOUNTER — Ambulatory Visit
Admission: RE | Admit: 2022-06-19 | Discharge: 2022-06-19 | Disposition: A | Discharge status: Home / Self Care | Payer: Medicaid Other | Source: Ambulatory Visit | Attending: Internal Medicine | Admitting: Internal Medicine

## 2022-06-19 ENCOUNTER — Inpatient Hospital Stay: Payer: Medicaid Other

## 2022-06-19 DIAGNOSIS — C3412 Malignant neoplasm of upper lobe, left bronchus or lung: Secondary | ICD-10-CM | POA: Diagnosis not present

## 2022-06-19 DIAGNOSIS — C7931 Secondary malignant neoplasm of brain: Secondary | ICD-10-CM | POA: Diagnosis present

## 2022-06-19 DIAGNOSIS — C3490 Malignant neoplasm of unspecified part of unspecified bronchus or lung: Secondary | ICD-10-CM | POA: Diagnosis present

## 2022-06-19 MED ORDER — GADOBUTROL 1 MMOL/ML IV SOLN
7.5000 mL | Freq: Once | INTRAVENOUS | Status: AC | PRN
  Administered 2022-06-19: 7.5 mL via INTRAVENOUS

## 2022-06-20 NOTE — Telephone Encounter (Signed)
Results for MRI are in the chart.

## 2022-06-23 ENCOUNTER — Other Ambulatory Visit: Payer: Self-pay | Admitting: *Deleted

## 2022-06-23 ENCOUNTER — Telehealth: Payer: Self-pay | Admitting: *Deleted

## 2022-06-23 MED ORDER — OXYCODONE HCL 20 MG PO TABS: 0.5000 | ORAL_TABLET | ORAL | 0 refills | Status: DC | PRN

## 2022-06-23 MED ORDER — OXYCODONE HCL 10 MG PO TABS: 10.0000 mg | ORAL_TABLET | ORAL | 0 refills | Status: DC | PRN

## 2022-06-23 NOTE — Telephone Encounter (Signed)
Received message from pharmacy that oxycodone 10mg  is out of stock but have 20mg  oxycodone tablets in stock. Per Merrily Pew, okay to send in prescription for oxycodone 20mg  tablets to take 0.5-1 tab every 4 hours as needed. Pt has been made aware and given instructions to adhere to prescription guidelines and not take more than prescribed. Pt verbalized understanding. Nothing further needed at this time.

## 2022-06-23 NOTE — Addendum Note (Signed)
Addended by: Telford Nab on: 06/23/2022 01:41 PM   Modules accepted: Orders

## 2022-06-23 NOTE — Telephone Encounter (Signed)
Duplicate msg.

## 2022-06-24 ENCOUNTER — Ambulatory Visit: Payer: Medicaid Other

## 2022-06-26 MED FILL — Dexamethasone Sodium Phosphate Inj 100 MG/10ML: INTRAMUSCULAR | Qty: 1 | Status: AC

## 2022-06-27 ENCOUNTER — Inpatient Hospital Stay (HOSPITAL_BASED_OUTPATIENT_CLINIC_OR_DEPARTMENT_OTHER): Payer: Medicaid Other | Admitting: Nurse Practitioner

## 2022-06-27 ENCOUNTER — Inpatient Hospital Stay: Payer: Medicaid Other

## 2022-06-27 ENCOUNTER — Ambulatory Visit
Admission: RE | Admit: 2022-06-27 | Discharge: 2022-06-27 | Disposition: A | Discharge status: Home / Self Care | Payer: Medicaid Other | Source: Ambulatory Visit | Attending: Oncology | Admitting: Oncology

## 2022-06-27 ENCOUNTER — Inpatient Hospital Stay: Payer: Medicaid Other | Attending: Internal Medicine

## 2022-06-27 ENCOUNTER — Other Ambulatory Visit: Payer: Self-pay | Admitting: *Deleted

## 2022-06-27 ENCOUNTER — Other Ambulatory Visit: Payer: Self-pay | Admitting: Oncology

## 2022-06-27 ENCOUNTER — Inpatient Hospital Stay (HOSPITAL_BASED_OUTPATIENT_CLINIC_OR_DEPARTMENT_OTHER): Payer: Medicaid Other | Admitting: Hospice and Palliative Medicine

## 2022-06-27 ENCOUNTER — Encounter: Payer: Self-pay | Admitting: Internal Medicine

## 2022-06-27 ENCOUNTER — Encounter: Payer: Self-pay | Admitting: Nurse Practitioner

## 2022-06-27 ENCOUNTER — Ambulatory Visit: Payer: Medicaid Other | Admitting: Oncology

## 2022-06-27 ENCOUNTER — Other Ambulatory Visit: Payer: Self-pay

## 2022-06-27 ENCOUNTER — Ambulatory Visit
Admission: RE | Admit: 2022-06-27 | Discharge: 2022-06-27 | Disposition: A | Discharge status: Home / Self Care | Payer: Medicaid Other | Source: Ambulatory Visit | Attending: Nurse Practitioner | Admitting: Nurse Practitioner

## 2022-06-27 VITALS — BP 161/86 | HR 99 | Temp 96.9°F | Ht 69.0 in | Wt 157.0 lb

## 2022-06-27 DIAGNOSIS — I10 Essential (primary) hypertension: Secondary | ICD-10-CM | POA: Diagnosis not present

## 2022-06-27 DIAGNOSIS — Z0283 Encounter for blood-alcohol and blood-drug test: Secondary | ICD-10-CM

## 2022-06-27 DIAGNOSIS — C3412 Malignant neoplasm of upper lobe, left bronchus or lung: Secondary | ICD-10-CM | POA: Diagnosis present

## 2022-06-27 DIAGNOSIS — Z5112 Encounter for antineoplastic immunotherapy: Secondary | ICD-10-CM | POA: Diagnosis present

## 2022-06-27 DIAGNOSIS — Z79899 Other long term (current) drug therapy: Secondary | ICD-10-CM | POA: Diagnosis not present

## 2022-06-27 DIAGNOSIS — C3490 Malignant neoplasm of unspecified part of unspecified bronchus or lung: Secondary | ICD-10-CM | POA: Insufficient documentation

## 2022-06-27 DIAGNOSIS — Z515 Encounter for palliative care: Secondary | ICD-10-CM | POA: Diagnosis not present

## 2022-06-27 DIAGNOSIS — C7951 Secondary malignant neoplasm of bone: Secondary | ICD-10-CM | POA: Insufficient documentation

## 2022-06-27 DIAGNOSIS — G35 Multiple sclerosis: Secondary | ICD-10-CM | POA: Insufficient documentation

## 2022-06-27 DIAGNOSIS — G893 Neoplasm related pain (acute) (chronic): Secondary | ICD-10-CM

## 2022-06-27 DIAGNOSIS — Z87891 Personal history of nicotine dependence: Secondary | ICD-10-CM | POA: Insufficient documentation

## 2022-06-27 DIAGNOSIS — R451 Restlessness and agitation: Secondary | ICD-10-CM | POA: Diagnosis not present

## 2022-06-27 DIAGNOSIS — Z5111 Encounter for antineoplastic chemotherapy: Secondary | ICD-10-CM

## 2022-06-27 DIAGNOSIS — Z79891 Long term (current) use of opiate analgesic: Secondary | ICD-10-CM | POA: Diagnosis not present

## 2022-06-27 DIAGNOSIS — R002 Palpitations: Secondary | ICD-10-CM | POA: Insufficient documentation

## 2022-06-27 DIAGNOSIS — R0781 Pleurodynia: Secondary | ICD-10-CM

## 2022-06-27 DIAGNOSIS — D649 Anemia, unspecified: Secondary | ICD-10-CM | POA: Insufficient documentation

## 2022-06-27 DIAGNOSIS — C787 Secondary malignant neoplasm of liver and intrahepatic bile duct: Secondary | ICD-10-CM | POA: Diagnosis not present

## 2022-06-27 DIAGNOSIS — C7931 Secondary malignant neoplasm of brain: Secondary | ICD-10-CM

## 2022-06-27 DIAGNOSIS — R0789 Other chest pain: Secondary | ICD-10-CM | POA: Insufficient documentation

## 2022-06-27 LAB — CBC WITH DIFFERENTIAL/PLATELET
Abs Immature Granulocytes: 0.11 10*3/uL — ABNORMAL HIGH (ref 0.00–0.07)
Basophils Absolute: 0 10*3/uL (ref 0.0–0.1)
Basophils Relative: 0 %
Eosinophils Absolute: 0.1 10*3/uL (ref 0.0–0.5)
Eosinophils Relative: 0 %
HCT: 36.9 % — ABNORMAL LOW (ref 39.0–52.0)
Hemoglobin: 11.4 g/dL — ABNORMAL LOW (ref 13.0–17.0)
Immature Granulocytes: 1 %
Lymphocytes Relative: 9 %
Lymphs Abs: 1.3 10*3/uL (ref 0.7–4.0)
MCH: 30.5 pg (ref 26.0–34.0)
MCHC: 30.9 g/dL (ref 30.0–36.0)
MCV: 98.7 fL (ref 80.0–100.0)
Monocytes Absolute: 0.9 10*3/uL (ref 0.1–1.0)
Monocytes Relative: 6 %
Neutro Abs: 12.1 10*3/uL — ABNORMAL HIGH (ref 1.7–7.7)
Neutrophils Relative %: 84 %
Platelets: 608 10*3/uL — ABNORMAL HIGH (ref 150–400)
RBC: 3.74 MIL/uL — ABNORMAL LOW (ref 4.22–5.81)
RDW: 18.4 % — ABNORMAL HIGH (ref 11.5–15.5)
WBC: 14.5 10*3/uL — ABNORMAL HIGH (ref 4.0–10.5)
nRBC: 0 % (ref 0.0–0.2)

## 2022-06-27 LAB — URINALYSIS, DIPSTICK ONLY
Bilirubin Urine: NEGATIVE
Glucose, UA: NEGATIVE mg/dL
Hgb urine dipstick: NEGATIVE
Ketones, ur: NEGATIVE mg/dL
Leukocytes,Ua: NEGATIVE
Nitrite: NEGATIVE
Protein, ur: NEGATIVE mg/dL
Specific Gravity, Urine: 1.017 (ref 1.005–1.030)
pH: 5 (ref 5.0–8.0)

## 2022-06-27 LAB — COMPREHENSIVE METABOLIC PANEL
ALT: 11 U/L (ref 0–44)
AST: 20 U/L (ref 15–41)
Albumin: 3 g/dL — ABNORMAL LOW (ref 3.5–5.0)
Alkaline Phosphatase: 99 U/L (ref 38–126)
Anion gap: 9 (ref 5–15)
BUN: 10 mg/dL (ref 6–20)
CO2: 26 mmol/L (ref 22–32)
Calcium: 9.2 mg/dL (ref 8.9–10.3)
Chloride: 101 mmol/L (ref 98–111)
Creatinine, Ser: 0.81 mg/dL (ref 0.61–1.24)
GFR, Estimated: >60 mL/min (ref >60)
Glucose, Bld: 107 mg/dL — ABNORMAL HIGH (ref 70–99)
Potassium: 4.6 mmol/L (ref 3.5–5.1)
Sodium: 136 mmol/L (ref 135–145)
Total Bilirubin: 0.4 mg/dL (ref 0.3–1.2)
Total Protein: 8.7 g/dL — ABNORMAL HIGH (ref 6.5–8.1)

## 2022-06-27 MED ORDER — HYDROMORPHONE HCL 1 MG/ML IJ SOLN
0.5000 mg | INTRAMUSCULAR | Status: AC
  Administered 2022-06-27: 0.5 mg via INTRAVENOUS
  Filled 2022-06-27: qty 1

## 2022-06-27 MED ORDER — SODIUM CHLORIDE 0.9 % IV SOLN
15.0000 mg/kg | Freq: Once | INTRAVENOUS | Status: AC
  Administered 2022-06-27: 1100 mg via INTRAVENOUS
  Filled 2022-06-27: qty 12

## 2022-06-27 MED ORDER — XTAMPZA ER 27 MG PO C12A: 1.0000 | EXTENDED_RELEASE_CAPSULE | Freq: Two times a day (BID) | ORAL | 0 refills | Status: DC

## 2022-06-27 MED ORDER — SODIUM CHLORIDE 0.9 % IV SOLN
Freq: Once | INTRAVENOUS | Status: AC
  Filled 2022-06-27: qty 250

## 2022-06-27 MED ORDER — OXYCODONE HCL 20 MG PO TABS: 1.0000 | ORAL_TABLET | ORAL | 0 refills | Status: DC | PRN

## 2022-06-27 MED ORDER — SODIUM CHLORIDE 0.9 % IV SOLN
10.0000 mg | Freq: Once | INTRAVENOUS | Status: AC
  Administered 2022-06-27: 10 mg via INTRAVENOUS
  Filled 2022-06-27: qty 10

## 2022-06-27 MED ORDER — CYANOCOBALAMIN 1000 MCG/ML IJ SOLN
1000.0000 ug | Freq: Once | INTRAMUSCULAR | Status: AC
  Administered 2022-06-27: 1000 ug via INTRAMUSCULAR
  Filled 2022-06-27: qty 1

## 2022-06-27 MED ORDER — SODIUM CHLORIDE 0.9 % IV SOLN
500.0000 mg/m2 | Freq: Once | INTRAVENOUS | Status: AC
  Administered 2022-06-27: 1000 mg via INTRAVENOUS
  Filled 2022-06-27: qty 40

## 2022-06-27 MED ORDER — ACETAMINOPHEN 325 MG PO TABS
650.0000 mg | ORAL_TABLET | Freq: Once | ORAL | Status: AC
  Administered 2022-06-27: 650 mg via ORAL
  Filled 2022-06-27: qty 2

## 2022-06-27 MED ORDER — MELOXICAM 7.5 MG PO TABS: 7.5000 mg | ORAL_TABLET | Freq: Every day | ORAL | 0 refills | Status: DC
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
## 2022-06-27 NOTE — Progress Notes (Signed)
BP 161/86. Per Beckey Rutter, NP okay to proceed with Zirabev.  Shi Blankenship [employer]
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
## 2022-06-27 NOTE — Patient Instructions (Signed)
[facility] AT [place]  Discharge Instructions: Thank you for choosing [facility] to provide your oncology and hematology care.  If you have a lab appointment with the [facility], please go directly to the [facility] and check in at the registration area.  Wear comfortable clothing and clothing appropriate for easy access to any Portacath or PICC line.   We strive to give you quality time with your provider. You may need to reschedule your appointment if you arrive late (15 or more minutes).  Arriving late affects you and other patients whose appointments are after yours.  Also, if you miss three or more appointments without notifying the office, you may be dismissed from the clinic at the provider's discretion.      For prescription refill requests, have your pharmacy contact our office and allow 72 hours for refills to be completed.    Today you received the following chemotherapy and/or immunotherapy agents Zirabev and Alimta    To help prevent nausea and vomiting after your treatment, we encourage you to take your nausea medication as directed.  BELOW ARE SYMPTOMS THAT SHOULD BE REPORTED IMMEDIATELY: *FEVER GREATER THAN 100.4 F (38 C) OR HIGHER *CHILLS OR SWEATING *NAUSEA AND VOMITING THAT IS NOT CONTROLLED WITH YOUR NAUSEA MEDICATION *UNUSUAL SHORTNESS OF BREATH *UNUSUAL BRUISING OR BLEEDING *URINARY PROBLEMS (pain or burning when urinating, or frequent urination) *BOWEL PROBLEMS (unusual diarrhea, constipation, pain near the anus) TENDERNESS IN MOUTH AND THROAT WITH OR WITHOUT PRESENCE OF ULCERS (sore throat, sores in mouth, or a toothache) UNUSUAL RASH, SWELLING OR PAIN  UNUSUAL VAGINAL DISCHARGE OR ITCHING   Items with * indicate a potential emergency and should be followed up as soon as possible or go to the Emergency Department if any problems should occur.  Please show the CHEMOTHERAPY ALERT CARD or IMMUNOTHERAPY ALERT CARD at  check-in to the Emergency Department and triage nurse.  Should you have questions after your visit or need to cancel or reschedule your appointment, please contact [facility] [place] AT [place]  [phone] and follow the prompts.  Office hours are 8:00 a.m. to 4:30 p.m. Monday - Friday. Please note that voicemails left after 4:00 p.m. may not be returned until the following business day.  We are closed weekends and major holidays. You have access to a nurse at all times for urgent questions. Please call the main number to the clinic [phone] and follow the prompts.  For any non-urgent questions, you may also contact your provider using MyChart. We now offer e-Visits for anyone 71 and older to request care online for non-urgent symptoms. For details visit [email or web address].   Also download the MyChart app! Go to the app store, search "MyChart", open the app, select [facility], and log in with your MyChart username and password.  Masks are optional in the cancer centers. If you would like for your care team to wear a mask while they are taking care of you, please let them know. For doctor visits, patients may have with them one support person who is at least 57 years old. At this time, visitors are not allowed in the infusion area.

## 2022-06-30 ENCOUNTER — Telehealth: Payer: Self-pay | Admitting: *Deleted

## 2022-06-30 NOTE — Telephone Encounter (Signed)
Insurance will only allow 7 days supply of xtampza. PA required.   [place]: [facility]- pa submitted

## 2022-07-01 ENCOUNTER — Encounter: Payer: Self-pay | Admitting: Internal Medicine

## 2022-07-01 NOTE — Telephone Encounter (Signed)
Ryan Cannon ([place]: [place]) - [id number] Ginger Organ ER 27MG  er capsules Status: PA Response - ApprovedCreated: December 11th, 2023Sent: December 11th, 2023

## 2022-07-02 ENCOUNTER — Telehealth: Payer: Self-pay | Admitting: *Deleted

## 2022-07-02 NOTE — Telephone Encounter (Signed)
Pt called in to ask what he can do regarding his rib pain. Pt states that he remembers discussing with the nurse practitioner about getting a procedure done to address his pain. Per Ander Purpura, NP last note from 12/8 pt is advised to continue with rest and comfort measures at this time and will consider further treatment with possible radiation therapy vs nerve block after PET scan completed. Reviewed pt's prescriptions for xtampza ER and oxycodone IR to continue taking at this time as prescribed and will let him know about next steps regarding rib pain after his PET scan. Pt states that he is having more frequent hiccups and asked if could take thorazine previously prescribed. Informed pt to try at this time and call back if not better or symptoms worsen. Pt verbalized understanding. Nothing further needed at this time.

## 2022-07-03 ENCOUNTER — Other Ambulatory Visit: Payer: Self-pay | Admitting: *Deleted

## 2022-07-03 MED ORDER — CHLORPROMAZINE HCL 25 MG PO TABS: 25.0000 mg | ORAL_TABLET | Freq: Three times a day (TID) | ORAL | 1 refills | Status: DC | PRN

## 2022-07-03 NOTE — Telephone Encounter (Signed)
Pt called in to report starting to have hiccups and has been taking thorazine which helps but needs a refill. Per Josh, okay to refill at this time. Prescription sent into pharmacy.

## 2022-07-04 ENCOUNTER — Telehealth: Payer: Self-pay | Admitting: *Deleted

## 2022-07-04 ENCOUNTER — Other Ambulatory Visit: Payer: Self-pay | Admitting: *Deleted

## 2022-07-04 MED ORDER — GABAPENTIN 100 MG PO CAPS: 100.0000 mg | ORAL_CAPSULE | Freq: Three times a day (TID) | ORAL | 1 refills | Status: DC

## 2022-07-04 MED ORDER — OXYCODONE HCL 20 MG PO TABS: 1.0000 | ORAL_TABLET | ORAL | 0 refills | Status: DC | PRN

## 2022-07-04 MED ORDER — OMEPRAZOLE 20 MG PO CPDR: 20.0000 mg | DELAYED_RELEASE_CAPSULE | Freq: Every day | ORAL | 1 refills | Status: DC

## 2022-07-04 NOTE — Telephone Encounter (Signed)
Pt continues to have hiccups despite taking thorazaine 25mg . Per Merrily Pew, pt may try gabapentin 100mg  TID along with omeprazole 20mg  daily. Rx has been sent in and pt advised to stop thorazaine at this time.

## 2022-07-08 ENCOUNTER — Ambulatory Visit
Admission: RE | Admit: 2022-07-08 | Discharge: 2022-07-08 | Disposition: A | Discharge status: Home / Self Care | Payer: Medicaid Other | Source: Ambulatory Visit | Attending: Oncology | Admitting: Oncology

## 2022-07-08 ENCOUNTER — Inpatient Hospital Stay: Payer: Medicaid Other

## 2022-07-08 DIAGNOSIS — J439 Emphysema, unspecified: Secondary | ICD-10-CM | POA: Insufficient documentation

## 2022-07-08 DIAGNOSIS — C3412 Malignant neoplasm of upper lobe, left bronchus or lung: Secondary | ICD-10-CM | POA: Insufficient documentation

## 2022-07-08 DIAGNOSIS — C787 Secondary malignant neoplasm of liver and intrahepatic bile duct: Secondary | ICD-10-CM | POA: Insufficient documentation

## 2022-07-08 DIAGNOSIS — Z923 Personal history of irradiation: Secondary | ICD-10-CM | POA: Diagnosis not present

## 2022-07-08 DIAGNOSIS — R9389 Abnormal findings on diagnostic imaging of other specified body structures: Secondary | ICD-10-CM | POA: Insufficient documentation

## 2022-07-08 LAB — GLUCOSE, CAPILLARY: Glucose-Capillary: 114 mg/dL — ABNORMAL HIGH (ref 70–99)

## 2022-07-08 MED ORDER — FLUDEOXYGLUCOSE F - 18 (FDG) INJECTION
8.5700 | Freq: Once | INTRAVENOUS | Status: AC | PRN
  Administered 2022-07-08: 8.57 via INTRAVENOUS
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
## 2022-07-09 ENCOUNTER — Ambulatory Visit: Payer: Medicaid Other | Attending: Internal Medicine | Admitting: Internal Medicine

## 2022-07-09 ENCOUNTER — Encounter: Payer: Self-pay | Admitting: Internal Medicine

## 2022-07-15 ENCOUNTER — Inpatient Hospital Stay (HOSPITAL_BASED_OUTPATIENT_CLINIC_OR_DEPARTMENT_OTHER): Payer: Medicaid Other | Admitting: Internal Medicine

## 2022-07-15 ENCOUNTER — Encounter: Payer: Self-pay | Admitting: Internal Medicine

## 2022-07-15 ENCOUNTER — Inpatient Hospital Stay: Payer: Medicaid Other

## 2022-07-15 VITALS — BP 127/106 | HR 109 | Temp 97.2°F | Resp 20 | Wt 147.2 lb

## 2022-07-15 DIAGNOSIS — C3412 Malignant neoplasm of upper lobe, left bronchus or lung: Secondary | ICD-10-CM

## 2022-07-15 DIAGNOSIS — K769 Liver disease, unspecified: Secondary | ICD-10-CM

## 2022-07-15 DIAGNOSIS — Z5111 Encounter for antineoplastic chemotherapy: Secondary | ICD-10-CM | POA: Diagnosis not present

## 2022-07-15 MED ORDER — OXYCODONE HCL 20 MG PO TABS: 1.0000 | ORAL_TABLET | Freq: Four times a day (QID) | ORAL | 0 refills | Status: DC | PRN

## 2022-07-15 NOTE — Progress Notes (Unsigned)
New difficulty breathing with walking.  In visible distress with breathing while walking to exam room.  Pulse ox on room air 98%  Left side rib pain "feels like squeezing heart" that is very painful.    Patient reports he is out of both Oxycodone ER and Oxycodone HCL.  Reports that current pain med regimen does help control pain.  Bp today 127/106, HR 109.  Not taking Amlodipine as listed on med list and not sure when last taken.    Loss of appetite with 10 lb wt loss since 06/27/22.
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
## 2022-07-16 ENCOUNTER — Ambulatory Visit
Admission: RE | Admit: 2022-07-16 | Discharge: 2022-07-16 | Disposition: A | Discharge status: Home / Self Care | Payer: Medicaid Other | Source: Ambulatory Visit | Attending: Radiation Oncology | Admitting: Radiation Oncology

## 2022-07-16 ENCOUNTER — Inpatient Hospital Stay: Payer: Medicaid Other

## 2022-07-16 ENCOUNTER — Encounter: Payer: Self-pay | Admitting: Internal Medicine

## 2022-07-16 ENCOUNTER — Encounter: Payer: Self-pay | Admitting: Radiation Oncology

## 2022-07-16 ENCOUNTER — Telehealth: Payer: Self-pay | Admitting: Internal Medicine

## 2022-07-16 ENCOUNTER — Telehealth: Payer: Self-pay | Admitting: *Deleted

## 2022-07-16 ENCOUNTER — Other Ambulatory Visit: Payer: Self-pay

## 2022-07-16 VITALS — BP 131/81 | HR 103 | Temp 98.0°F | Resp 24 | Ht 69.0 in | Wt 144.4 lb

## 2022-07-16 DIAGNOSIS — C7931 Secondary malignant neoplasm of brain: Secondary | ICD-10-CM | POA: Insufficient documentation

## 2022-07-16 DIAGNOSIS — C3412 Malignant neoplasm of upper lobe, left bronchus or lung: Secondary | ICD-10-CM

## 2022-07-16 DIAGNOSIS — C787 Secondary malignant neoplasm of liver and intrahepatic bile duct: Secondary | ICD-10-CM | POA: Diagnosis not present

## 2022-07-16 DIAGNOSIS — Z923 Personal history of irradiation: Secondary | ICD-10-CM | POA: Diagnosis not present

## 2022-07-16 DIAGNOSIS — C7951 Secondary malignant neoplasm of bone: Secondary | ICD-10-CM | POA: Diagnosis not present

## 2022-07-16 DIAGNOSIS — C3480 Malignant neoplasm of overlapping sites of unspecified bronchus and lung: Secondary | ICD-10-CM

## 2022-07-16 LAB — MISC LABCORP TEST (SEND OUT): Labcorp test code: 791194

## 2022-07-16 MED ORDER — ALBUTEROL SULFATE HFA 108 (90 BASE) MCG/ACT IN AERS: 2.0000 | INHALATION_SPRAY | Freq: Four times a day (QID) | RESPIRATORY_TRACT | 2 refills | Status: DC | PRN

## 2022-07-16 NOTE — Telephone Encounter (Signed)
Schedule request faxed to specialty scheduling.

## 2022-07-16 NOTE — Telephone Encounter (Signed)
Please schedule-liver biopsy ASAP;   Follow-up-MD 4 to 5 days after the liver biopsy.   Hayley-when biopsy available order NGS/foundation 1; PD-L1/keytruda.   GB 

## 2022-07-16 NOTE — Progress Notes (Signed)
Radiation Oncology Follow up Note old patient new area left sided rib pain  Name: Ryan Cannon   Date:   07/16/2022 MRN:  [id number] DOB: [birth year]    This 56 y.o. male presents to the clinic today for left-sided rib pain with PET scan showing involvement of ribs and patient with known stage IV lung cancer previously treated to his right scapula as well as brain.  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  He has been treated with carboplatinum Alimta and Avastin which she tolerated well.  His pain in his right shoulder has markedly improved he is having no neurologic symptoms.Marland Kitchen  He is currently onPemetrexed + Carboplatin + Bevacizumab q21d   which may be put on hold based on his progressive disease.  He has been having increasing left-sided chest pain and most recent PET CT scan shows progressive hypermetabolic metastatic disease within the liver bone and possible left adrenal gland.  He has evidence of metastatic involvement in multiple ribs as well as his left chest and is now seen for possible palliative radiation therapy to these areas.  COMPLICATIONS OF TREATMENT: none  FOLLOW UP COMPLIANCE: keeps appointments   PHYSICAL EXAM:  BP 131/81 (BP Location: Left Arm, Patient Position: Sitting, Cuff Size: Normal)   Pulse (!) 103   Temp 98 F (36.7 C) (Tympanic)   Resp (!) 24   Ht 5\' 9"  (1.753 m)   Wt 144 lb 6.4 oz (65.5 kg)   BMI 21.32 kg/m  Patient does have pain to palpation of the left chest wall.  He is having no significant pain elsewhere.  Well-developed well-nourished patient in NAD. HEENT reveals PERLA, EOMI, discs not visualized.  Oral cavity is clear. No oral mucosal lesions are identified. Neck is clear without evidence of cervical or supraclavicular adenopathy.  Lungs are clear to A&P. Cardiac examination is essentially unremarkable with regular rate and rhythm without murmur rub or thrill. Abdomen is benign with no organomegaly or masses noted. Motor sensory and DTR levels are equal and symmetric in the upper and lower extremities. Cranial nerves II through XII are grossly intact. Proprioception is intact. No peripheral adenopathy or edema is identified. No motor or sensory levels are noted. Crude visual fields are within normal range.  RADIOLOGY RESULTS: PET scan reviewed compatible with above-stated findings  PLAN: This time lets go ahead with a short palliative course of radiation therapy to his left ribs and chest.  Will try a hybrid plan to incorporate all areas of involvement by PET/CT criteria and treat to 30 Gray in 5 fractions risks and benefits of treatment occluding possible skin reaction possible fatigue possible alteration of some superficial lung parenchyma all were discussed in detail with the patient.  I have set him up for simulation tomorrow.  Patient comprehends my recommendations well.  I would like to take this opportunity to thank you for allowing me to participate in the care of your patient.Noreene Filbert, MD

## 2022-07-16 NOTE — Telephone Encounter (Signed)
Call placed to patient asking if he would be able to provide his own transportation from biopsy appt.  He will check with his sister and request a call back tomorrow.

## 2022-07-16 NOTE — Telephone Encounter (Signed)
Received an incoming msg from Dr. Baruch Gouty team today. Pt c/o shortness of breath. Sats at 93% at room air. Pt requesting oxygen. However, given his sats are above 92% this does not qualify him for oxygen therapy. Per Dr. Aletha Halim clinical team, pt is only taking pain meds and not his inhalers. In fact, pt does not have any inhaler available. We spoke with Merrily Pew, NP who sent in a RF for the albuterol. Duwayne Heck, CMA informed pt that script was being sent in to his pharmacy. Pt needs to try this intervention first

## 2022-07-17 ENCOUNTER — Ambulatory Visit
Admission: RE | Admit: 2022-07-17 | Discharge: 2022-07-17 | Disposition: A | Discharge status: Home / Self Care | Payer: Medicaid Other | Source: Ambulatory Visit | Attending: Radiation Oncology | Admitting: Radiation Oncology

## 2022-07-17 ENCOUNTER — Inpatient Hospital Stay: Payer: Medicaid Other

## 2022-07-17 DIAGNOSIS — C7931 Secondary malignant neoplasm of brain: Secondary | ICD-10-CM | POA: Diagnosis not present

## 2022-07-17 DIAGNOSIS — C3412 Malignant neoplasm of upper lobe, left bronchus or lung: Secondary | ICD-10-CM | POA: Insufficient documentation

## 2022-07-17 DIAGNOSIS — C787 Secondary malignant neoplasm of liver and intrahepatic bile duct: Secondary | ICD-10-CM | POA: Insufficient documentation

## 2022-07-17 DIAGNOSIS — C7951 Secondary malignant neoplasm of bone: Secondary | ICD-10-CM | POA: Insufficient documentation

## 2022-07-17 NOTE — Addendum Note (Signed)
Addended by: Vanice Sarah on: 07/17/2022 02:10 PM   Modules accepted: Orders

## 2022-07-17 NOTE — Telephone Encounter (Signed)
Patient states that his sister can't provide transportation right now because she is going to be out of town.  Patient says with it being holiday season it would be difficult for anyone to provide him personal transportation.  He says that he can get his own ride home but would like to have bx schedule middle of January 2024 and not too early in morning.  I informed him that [facility] transportation can bring him to the bx appt and he stated he would get his own ride and he will let me know.  Specialty scheduling informed of pt schedule preferences.

## 2022-07-17 NOTE — Telephone Encounter (Signed)
Updated Dr. B with patient request to have liver bx postponed will med January.  Dr. B would like to hold scheduling biopsy at this time and have the patient follow up with lab (cbc cmp)/MD mid January.  Pamala Hurry please schedule mid January appt as requested by MD and inform pt of appt details.

## 2022-07-18 ENCOUNTER — Ambulatory Visit: Payer: Medicaid Other

## 2022-07-18 ENCOUNTER — Other Ambulatory Visit: Payer: Medicaid Other

## 2022-07-18 ENCOUNTER — Ambulatory Visit: Payer: Medicaid Other | Admitting: Internal Medicine

## 2022-07-22 ENCOUNTER — Telehealth: Payer: Self-pay | Admitting: *Deleted

## 2022-07-22 ENCOUNTER — Inpatient Hospital Stay: Payer: Medicaid Other

## 2022-07-22 ENCOUNTER — Inpatient Hospital Stay: Payer: Medicaid Other | Attending: Internal Medicine | Admitting: Hospice and Palliative Medicine

## 2022-07-22 DIAGNOSIS — R0602 Shortness of breath: Secondary | ICD-10-CM

## 2022-07-22 DIAGNOSIS — C787 Secondary malignant neoplasm of liver and intrahepatic bile duct: Secondary | ICD-10-CM | POA: Insufficient documentation

## 2022-07-22 DIAGNOSIS — J438 Other emphysema: Secondary | ICD-10-CM

## 2022-07-22 DIAGNOSIS — G893 Neoplasm related pain (acute) (chronic): Secondary | ICD-10-CM | POA: Insufficient documentation

## 2022-07-22 DIAGNOSIS — J4489 Other specified chronic obstructive pulmonary disease: Secondary | ICD-10-CM

## 2022-07-22 DIAGNOSIS — C3412 Malignant neoplasm of upper lobe, left bronchus or lung: Secondary | ICD-10-CM | POA: Insufficient documentation

## 2022-07-22 DIAGNOSIS — C7951 Secondary malignant neoplasm of bone: Secondary | ICD-10-CM | POA: Insufficient documentation

## 2022-07-22 DIAGNOSIS — C7931 Secondary malignant neoplasm of brain: Secondary | ICD-10-CM | POA: Insufficient documentation

## 2022-07-22 DIAGNOSIS — G35 Multiple sclerosis: Secondary | ICD-10-CM | POA: Insufficient documentation

## 2022-07-22 DIAGNOSIS — Z79899 Other long term (current) drug therapy: Secondary | ICD-10-CM | POA: Insufficient documentation

## 2022-07-22 DIAGNOSIS — R062 Wheezing: Secondary | ICD-10-CM

## 2022-07-22 DIAGNOSIS — M8440XA Pathological fracture, unspecified site, initial encounter for fracture: Secondary | ICD-10-CM | POA: Insufficient documentation

## 2022-07-22 DIAGNOSIS — Z87891 Personal history of nicotine dependence: Secondary | ICD-10-CM | POA: Insufficient documentation

## 2022-07-22 MED ORDER — IPRATROPIUM-ALBUTEROL 0.5-2.5 (3) MG/3ML IN SOLN: 3.0000 mL | RESPIRATORY_TRACT | 2 refills | Status: DC | PRN

## 2022-07-22 MED ORDER — PREDNISONE 10 MG PO TABS: ORAL_TABLET | ORAL | 0 refills | Status: DC

## 2022-07-22 NOTE — Progress Notes (Unsigned)
Virtual Visit via Telephone Note  I connected with Ryan Cannon on 07/22/22 at  2:00 PM EST by telephone and verified that I am speaking with the correct person using two identifiers.  Location: Patient: Home Provider: Clinic   I discussed the limitations, risks, security and privacy concerns of performing an evaluation and management service by telephone and the availability of in person appointments. I also discussed with the patient that there may be a patient responsible charge related to this service. The patient expressed understanding and agreed to proceed.   History of Present Illness: Ryan Cannon is a 57 y.o. male with multiple medical problems including multiple sclerosis and recently found to have probable stage IV lung cancer with metastasis to scapula and possible liver.  Patient has had recent PET with demonstrated progression.  Symptomatically, he has had shortness of breath and ongoing pain.  Palliative care was consulted to address goals and manage ongoing symptoms.  Observations/Objective: Patient was scheduled to see me in clinic today but says that he felt too poorly to come in.  I spoke with him by telephone instead.  PET scan on 07/08/2022 showed progressive hypermetabolic metastatic disease with multifocal hepatic, osseous and possible left adrenal metastases.  Patient had similar appearing left perihilar and right middle lobe lesions with possible postobstructive pneumonitis.  Patient has had ongoing pain, primarily in the left chest at site of known hypermetabolic rib.  He is pending RT to this area.   Today, patient says he is having worse shortness of breath.  Shortness of breath has been fairly persistent over the past weeks but patient says that he is having more exertional dyspnea/fatigue today.  No fever or chills.  He denies cough congestion but reports some wheezing.Marland Kitchen  He denies complaints or concerns.  I did discuss with patient most likely symptoms are  secondary to progressive cancer.  I reiterated palliative nature of treatment, although his goals are still clearly aligned with pursuing treatment if options are available.  Assessment and Plan: Stage IV lung cancer -progression on systemic chemotherapy.  Patient has follow-up with Dr. B to discuss further treatment options.  Neoplasm related pain -pending RT.  Continue Xtampza/oxycodone for now.    Dyspnea - Likely disease progression +/- COPD but will obtain CTA chest to r/o PE given chest pain, although CP more likely secondary to known rib mets.  Will start steroids and DuoNebs.  Plan follow-up in [facility] later this week to assess symptoms.  Case and plan discussed with Dr. Rogue Bussing  Follow Up Instructions: [facility] follow-up later this week   I discussed the assessment and treatment plan with the patient. The patient was provided an opportunity to ask questions and all were answered. The patient agreed with the plan and demonstrated an understanding of the instructions.   The patient was advised to call back or seek an in-person evaluation if the symptoms worsen or if the condition fails to improve as anticipated.  I provided 15 minutes of non-face-to-face time during this encounter.   Irean Hong, NP

## 2022-07-22 NOTE — Telephone Encounter (Signed)
Contacted Christin Cross at [employer]. Left vm for [employer] to return my phone call. Nebulizer machine ordered.

## 2022-07-23 ENCOUNTER — Encounter: Payer: Self-pay | Admitting: Internal Medicine

## 2022-07-23 ENCOUNTER — Ambulatory Visit: Payer: Medicaid Other

## 2022-07-23 ENCOUNTER — Other Ambulatory Visit: Payer: Self-pay | Admitting: *Deleted

## 2022-07-23 ENCOUNTER — Telehealth: Payer: Self-pay | Admitting: *Deleted

## 2022-07-23 ENCOUNTER — Inpatient Hospital Stay: Payer: Medicaid Other

## 2022-07-23 MED ORDER — OXYCODONE HCL 20 MG PO TABS: 1.0000 | ORAL_TABLET | Freq: Four times a day (QID) | ORAL | 0 refills | Status: DC | PRN

## 2022-07-23 NOTE — Telephone Encounter (Signed)
I spoke to patient. Pt called clinis morning to cnl all apt's and his ct for PE protocol.   He refuses to come to the clinic today b/c of his level of pain. His pharmacy/insurance will not allow him to RF his naroctics any sooner. Patient c/o persistent shortness of breath, but he has not started on his nebulizer treatment. [employer] has contacted him but he refuses to pay $40 for a nebulizer treatment. His sister offered to pay for the nebulizer equipment but she can not do this until tomorrow. He is c/o dyspnea and chest pain. Discussed w patient that josh recommended a stat CT to rule out a PE vs concerns for COPD or disease progression to explain his shortness of breath. He if continues to have shortness of breat and chest pain, then the providers advise him to go to the ER for his care. He stated that he has no transportation to get to the ER. Advsised pt to call 911 if he needs to go to the ER given his chest pain/dyspnea. Pt states that he would just probably wait until tomorrow until he can get his meds to see if this would work for him. I advised patient once again, that the ER is where he needs to be worked up given his worsening shortness of breath and chest pain. Pt would not promise RN that he would go to the ER. He has asked that all apts be cnl for the rest of the week as 'he is not feeling good."

## 2022-07-24 ENCOUNTER — Inpatient Hospital Stay: Payer: Medicaid Other

## 2022-07-24 ENCOUNTER — Ambulatory Visit: Admission: RE | Admit: 2022-07-24 | Payer: Medicaid Other | Source: Ambulatory Visit

## 2022-07-24 ENCOUNTER — Ambulatory Visit: Payer: Medicaid Other

## 2022-07-24 ENCOUNTER — Inpatient Hospital Stay: Payer: Medicaid Other | Admitting: Hospice and Palliative Medicine

## 2022-07-25 ENCOUNTER — Inpatient Hospital Stay: Payer: Medicaid Other

## 2022-07-25 ENCOUNTER — Ambulatory Visit: Payer: Medicaid Other

## 2022-07-28 ENCOUNTER — Ambulatory Visit: Payer: Medicaid Other

## 2022-07-28 ENCOUNTER — Encounter: Payer: Self-pay | Admitting: Internal Medicine

## 2022-07-28 ENCOUNTER — Inpatient Hospital Stay: Payer: Medicaid Other

## 2022-07-28 ENCOUNTER — Telehealth: Payer: Self-pay | Admitting: *Deleted

## 2022-07-28 DIAGNOSIS — C3490 Malignant neoplasm of unspecified part of unspecified bronchus or lung: Secondary | ICD-10-CM

## 2022-07-28 NOTE — Telephone Encounter (Signed)
Per pt's niece, Karen Kays, pt would like referral to home-based palliative care to help with symptoms and improve quality of life while pursuing further treatment. States that is interested in more supportive care at home during this time. Referral placed. Pt made aware.

## 2022-07-29 ENCOUNTER — Inpatient Hospital Stay: Payer: Medicaid Other

## 2022-07-29 ENCOUNTER — Telehealth: Payer: Self-pay

## 2022-07-29 ENCOUNTER — Ambulatory Visit: Payer: Medicaid Other

## 2022-07-29 NOTE — Telephone Encounter (Signed)
Palliative care outreach to patient to schedule initial PC visit.  Visit scheduled with patient for Tue 1/16 @11am  with RN/SW

## 2022-07-29 NOTE — Telephone Encounter (Signed)
palliative care outreach returing TC to niece, Karen Kays

## 2022-07-30 ENCOUNTER — Ambulatory Visit: Payer: Medicaid Other

## 2022-07-30 ENCOUNTER — Inpatient Hospital Stay: Payer: Medicaid Other

## 2022-07-31 ENCOUNTER — Ambulatory Visit: Payer: Medicaid Other

## 2022-07-31 ENCOUNTER — Other Ambulatory Visit: Payer: Self-pay | Admitting: *Deleted

## 2022-07-31 ENCOUNTER — Inpatient Hospital Stay (HOSPITAL_BASED_OUTPATIENT_CLINIC_OR_DEPARTMENT_OTHER): Payer: Medicaid Other | Admitting: Hospice and Palliative Medicine

## 2022-07-31 DIAGNOSIS — C3490 Malignant neoplasm of unspecified part of unspecified bronchus or lung: Secondary | ICD-10-CM

## 2022-07-31 DIAGNOSIS — C7951 Secondary malignant neoplasm of bone: Secondary | ICD-10-CM | POA: Diagnosis not present

## 2022-07-31 MED ORDER — CHLORPROMAZINE HCL 25 MG PO TABS: 25.0000 mg | ORAL_TABLET | Freq: Three times a day (TID) | ORAL | 1 refills | Status: DC | PRN

## 2022-07-31 NOTE — Telephone Encounter (Signed)
Refill request from pharmacy /

## 2022-07-31 NOTE — Progress Notes (Signed)
Virtual Visit via Telephone Note  I connected with Ryan Cannon on 07/31/22 at 10:45 AM EST by telephone and verified that I am speaking with the correct person using two identifiers.  Location: Patient: Home Provider: Clinic   I discussed the limitations, risks, security and privacy concerns of performing an evaluation and management service by telephone and the availability of in person appointments. I also discussed with the patient that there may be a patient responsible charge related to this service. The patient expressed understanding and agreed to proceed.   History of Present Illness: Ryan Cannon is a 57 y.o. male with multiple medical problems including multiple sclerosis and recently found to have probable stage IV lung cancer with metastasis to scapula and possible liver.  Patient has had recent PET with demonstrated progression.  Symptomatically, he has had shortness of breath and ongoing pain.  Palliative care was consulted to address goals and manage ongoing symptoms.  Observations/Objective: PET scan on 07/08/2022 showed progressive hypermetabolic metastatic disease with multifocal hepatic, osseous and possible left adrenal metastases.  Patient had similar appearing left perihilar and right middle lobe lesions with possible postobstructive pneumonitis.   Had a telephone visit today with patient and niece.  Patient was previously referred for CTA of the chest but canceled that.  Additionally he has canceled multiple clinic visits and XRT appointments.  Patient says that he has had worsening pain and shortness of breath.  Pain is localized to his left chest.  He has had falls and declining performance status.  I strongly encourage patient to present to the ED for further workup including ruling out PE.  However, patient adamant that he would not go to the emergency department.  He does say that he will now except outpatient workup.  Will coordinate rescheduling CTA/clinic  visit.  Patient says that he is taking two 20mg  oxycodone tablets every 6 hours with some improvement in his pain.  Patient says that he is not taking Xtampza ER or steroids and is not interested in other pain regimens at this time.   Assessment and Plan: Stage IV lung cancer -progression on systemic chemotherapy.  Patient has follow-up with Dr. B to discuss further treatment options.  Neoplasm related pain -continue oxycodone IR.  Dyspnea -symptoms likely reflective of disease progression as evidenced on recent PET scan.  However, would like to rule out PE with CTA.  Will reschedule as patient is not interested in ED or hospitalization.  Case and plan discussed with Dr. Rogue Bussing  Follow Up Instructions: [facility] follow-up   I discussed the assessment and treatment plan with the patient. The patient was provided an opportunity to ask questions and all were answered. The patient agreed with the plan and demonstrated an understanding of the instructions.   The patient was advised to call back or seek an in-person evaluation if the symptoms worsen or if the condition fails to improve as anticipated.  I provided 15 minutes of non-face-to-face time during this encounter.   Irean Hong, NP

## 2022-08-01 ENCOUNTER — Ambulatory Visit: Payer: Medicaid Other

## 2022-08-04 ENCOUNTER — Inpatient Hospital Stay: Payer: Medicaid Other

## 2022-08-04 ENCOUNTER — Other Ambulatory Visit: Payer: Self-pay | Admitting: Hospice and Palliative Medicine

## 2022-08-04 ENCOUNTER — Ambulatory Visit: Payer: Medicaid Other

## 2022-08-04 ENCOUNTER — Telehealth: Payer: Self-pay | Admitting: *Deleted

## 2022-08-04 ENCOUNTER — Ambulatory Visit
Admission: RE | Admit: 2022-08-04 | Discharge: 2022-08-04 | Disposition: A | Discharge status: Home / Self Care | Payer: Medicaid Other | Source: Ambulatory Visit | Attending: Hospice and Palliative Medicine | Admitting: Hospice and Palliative Medicine

## 2022-08-04 ENCOUNTER — Encounter: Payer: Self-pay | Admitting: Hospice and Palliative Medicine

## 2022-08-04 ENCOUNTER — Inpatient Hospital Stay (HOSPITAL_BASED_OUTPATIENT_CLINIC_OR_DEPARTMENT_OTHER): Payer: Medicaid Other | Admitting: Hospice and Palliative Medicine

## 2022-08-04 ENCOUNTER — Other Ambulatory Visit: Payer: Self-pay | Admitting: Internal Medicine

## 2022-08-04 ENCOUNTER — Telehealth: Payer: Self-pay | Admitting: Internal Medicine

## 2022-08-04 ENCOUNTER — Other Ambulatory Visit: Payer: Self-pay

## 2022-08-04 VITALS — BP 124/92 | HR 125 | Temp 96.1°F | Resp 24

## 2022-08-04 DIAGNOSIS — Z515 Encounter for palliative care: Secondary | ICD-10-CM

## 2022-08-04 DIAGNOSIS — G35 Multiple sclerosis: Secondary | ICD-10-CM | POA: Diagnosis not present

## 2022-08-04 DIAGNOSIS — E860 Dehydration: Secondary | ICD-10-CM

## 2022-08-04 DIAGNOSIS — C3412 Malignant neoplasm of upper lobe, left bronchus or lung: Secondary | ICD-10-CM

## 2022-08-04 DIAGNOSIS — S2232XA Fracture of one rib, left side, initial encounter for closed fracture: Secondary | ICD-10-CM

## 2022-08-04 DIAGNOSIS — C3490 Malignant neoplasm of unspecified part of unspecified bronchus or lung: Secondary | ICD-10-CM

## 2022-08-04 DIAGNOSIS — C7951 Secondary malignant neoplasm of bone: Secondary | ICD-10-CM

## 2022-08-04 DIAGNOSIS — G893 Neoplasm related pain (acute) (chronic): Secondary | ICD-10-CM

## 2022-08-04 DIAGNOSIS — Z87891 Personal history of nicotine dependence: Secondary | ICD-10-CM | POA: Diagnosis not present

## 2022-08-04 DIAGNOSIS — M8440XA Pathological fracture, unspecified site, initial encounter for fracture: Secondary | ICD-10-CM | POA: Diagnosis not present

## 2022-08-04 DIAGNOSIS — Z79899 Other long term (current) drug therapy: Secondary | ICD-10-CM | POA: Diagnosis not present

## 2022-08-04 LAB — POCT I-STAT CREATININE: Creatinine, Ser: 1 mg/dL (ref 0.61–1.24)

## 2022-08-04 MED ORDER — OXYCODONE HCL 5 MG PO TABS
15.0000 mg | ORAL_TABLET | Freq: Once | ORAL | Status: AC
  Administered 2022-08-04: 15 mg via ORAL
  Filled 2022-08-04: qty 3

## 2022-08-04 MED ORDER — XTAMPZA ER 27 MG PO C12A: 1.0000 | EXTENDED_RELEASE_CAPSULE | Freq: Two times a day (BID) | ORAL | 0 refills | Status: DC

## 2022-08-04 MED ORDER — SODIUM CHLORIDE 0.9 % IV SOLN
INTRAVENOUS | Status: DC
  Filled 2022-08-04: qty 250

## 2022-08-04 MED ORDER — GABAPENTIN 100 MG PO CAPS: 100.0000 mg | ORAL_CAPSULE | Freq: Three times a day (TID) | ORAL | 1 refills | Status: DC

## 2022-08-04 MED ORDER — HYDROMORPHONE HCL 1 MG/ML IJ SOLN: 0.5000 mg | Freq: Once | INTRAMUSCULAR | Status: DC

## 2022-08-04 MED ORDER — DEXAMETHASONE 4 MG PO TABS: 4.0000 mg | ORAL_TABLET | Freq: Every day | ORAL | 0 refills | Status: DC

## 2022-08-04 MED ORDER — IOHEXOL 350 MG/ML SOLN
75.0000 mL | Freq: Once | INTRAVENOUS | Status: AC | PRN
  Administered 2022-08-04: 75 mL via INTRAVENOUS

## 2022-08-04 MED ORDER — OXYCODONE HCL 20 MG PO TABS: 1.0000 | ORAL_TABLET | ORAL | 0 refills | Status: DC | PRN

## 2022-08-04 NOTE — Telephone Encounter (Signed)
I do long discussion the patient's niece Ms. Brooke Dare regarding patient's overall progression of cancer.  Patient's noncompliance with appointments.  Worsening pain.   Discussed this morning with Josh regarding progressive lung cancer noted on CT scan.  Unfortunately limited options.  Hospice is reasonable-if patient family is interested.   Happy to reevaluate the patient the clinic if interested in treatments,

## 2022-08-04 NOTE — Progress Notes (Signed)
Unable to obtain IV access for patient. Verlon Au, RN and myself attempted to stick patient 4 times. Pt refused any additional IV sticks today or lab work. Orders for iv fluids/dilaudid d/c on mar. Per Marcelyn Ditty, NP- ordered oxycodone 15 mg once. Patient declined to wait for post pain assessment.

## 2022-08-04 NOTE — Telephone Encounter (Signed)
Coordinated apts for ct guided - celiac plexus block. Spoke with patient - pt agreeable to the plan of care. Pt aware to arrive at the Heart/Vascular procedure entrance at [facility] (front entrance) at 10 am for check in. Procedure will be at 11 am (on Thursday 08/14/21 under the care of Dr. Bryn Gulling). Due to sedation - Pt instructed not to eat/drink anything after midnight prior to procedure. He was instructed that our Zenaida Niece can not transport him to this apt. He stated that he will have someone transport him to this apt.
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
## 2022-08-05 ENCOUNTER — Ambulatory Visit: Payer: Medicaid Other

## 2022-08-05 ENCOUNTER — Telehealth: Payer: Self-pay

## 2022-08-05 ENCOUNTER — Other Ambulatory Visit: Payer: Medicaid Other

## 2022-08-05 DIAGNOSIS — Z515 Encounter for palliative care: Secondary | ICD-10-CM

## 2022-08-05 NOTE — Progress Notes (Signed)
PATIENT NAME: Ryan Cannon DOB: 1966/06/13 MRN: [id number]  PRIMARY CARE PROVIDER: Lonell Face, MD  RESPONSIBLE PARTY:  Acct ID - Guarantor Home Phone Work Phone Relationship Acct Type  [id number] Ryan Cannon, GULLEY* [phone]  Self P/F     [address] [place], [place] [address]   Home visit completed with patient and [place], [place].   Palliative Care services explained and consents signed.   ACP:  needs ongoing discussion.  At this time, patient desires full code status.  Does not have HCPOA in place and defers at this time. No MOST form in place.   Appetite:  Relies on sister to bring fast foods. Eats about 1/2 of what is brought to him.  Drinking Ensure 3-4 x daily.  Thin and frail in appearance.  10 lb weight loss in the last month.    Lung Cancer with Metastasis:  Patient with shortness of breath with minimal exertion.  Productive cough and taking duonebs to help with breathing.  No oxygen in place at this time.  Patient has a follow up with oncology to discuss further interventions for his condition.   Medication Management:  Awaiting medication from pharmacy.  Has long acting and short acting pain medication, gabapentin and decadron that has not arrived.   Patient advised he will start once medications arrive.   Pain:  Left rib cage pain.  Has fracture to this area.   Currently taking oxycodone for pain management.  Taking 4 tabs daily.  "Helps a little bit"  and takes the edge of per patient.  Will have nerve block next week to address pain in the left rib cage.   Weakness:  Severely limited.  Patient mostly stays on the sofa at home.  Using [employer] transportation or sister to get to appointments.  Will ask for a wheelchair as patient does not currently have one and will benefit from this.   Patient agreeable for 2x a month visits.  SW will make next follow up visit.     CODE STATUS: Full ADVANCED DIRECTIVES: No MOST FORM: No PPS: weak 50%   PHYSICAL EXAM:    VITALS: Today's Vitals   08/05/22 1119  SpO2: 96%    LUNGS: clear to auscultation  CARDIAC: Cor Tachy}  EXTREMITIES: - for edema SKIN: no open area reported by patient.  NEURO: positive for weakness       Truitt Merle, RN

## 2022-08-05 NOTE — Progress Notes (Signed)
COMMUNITY PALLIATIVE CARE SW NOTE  PATIENT NAME: Ryan Cannon DOB: 01/26/1966 MRN: [id number]  PRIMARY CARE PROVIDER: Lonell Face, MD  RESPONSIBLE PARTY:  Acct ID - Guarantor Home Phone Work Phone Relationship Acct Type  [id number] [name], Cannon* [phone]  Self P/F     [address] [place], [place] [address]     PLAN OF CARE and INTERVENTIONS:              GOALS OF CARE/ ADVANCE CARE PLANNING:    Goals include to maximize quality of life and assist with pain management. Our advance care planning conversation included a discussion about:    The value and importance of advance care planning  Review and updating or creation of an advance directive document.                          Patient is a FULL CODE.   2.        SOCIAL/EMOTIONAL/SPIRITUAL ASSESSMENT/ INTERVENTIONS:         Palliative care encounter: SW and RN completed joint initial in home visit with patient.   Functional changes/updates: patient with lung cancer which is causing SHOB. Patient is doing breathing treatments but could benefit from oxygen. Patient has some pain to ribs. Patient follow up with oncology this week. Patient share he is very weak and unable to ambulate long distances without giving out of breath, can benefit from a WC.  Lung Cancer: patient is interested in a treatment plan. Currently receiving infusions. No adverse reactions.  PC will continue to monitor for hospice eligibility. Currently patient is interested in cancer treatment and quantity of life.   Psychosocial assessment: completed.   In home support: patient lives independently, has sister and mother as support. SW will complete PCS application for patient.  Transportation: no needs.  Food: no food insecurities witnessed. Patient share that he does not have a big appetite. Drinks ensure TID.  Safety and long term planning: patient feels safe in his home and desires to remain in his home.   SW discussed goals, reviewed care  plan, provided emotional support, used active and reflective listening in the form of reciprocity emotional response. Questions and concerns were addressed. The patient/family was encouraged to call with any additional questions and/or concerns. PC Provided general support and encouragement, no other unmet needs identified. Will continue to follow every 2 weeks.   3.         PATIENT/CAREGIVER EDUCATION/ COPING:   Appearance: well groomed, appropriate given situation  Mental Status: Alert/oriented to self. Eye Contact: Good. Able to engage in proper eye contact  Thought Process: irrational  Thought Content: not assessed  Speech: normal  Mood: Normal and calm Affect: Congruent to endorsed mood, full ranging Insight: normal Judgement: normal Interaction Style: Cooperative   Patient A&O x4 and to engage in fluent conversation. Patient attempting to cope with cancer dx and MS.     4.         PERSONAL EMERGENCY PLAN:  Patient will call 9-1-1 for emergencies.    5.         COMMUNITY RESOURCES COORDINATION/ HEALTH CARE NAVIGATION:  patient manages his care.   6.      FINANCIAL CONCERNS/NEEDS: none                         Primary Health Insurance:  [facility] Medicaid Secondary Health Insurance: none Prescription Coverage: Yes,  no history of difficulty obtaining or affording prescriptions reported.     SOCIAL HX:  Social History   Tobacco Use   Smoking status: Former    Packs/day: 1.00    Years: 40.00    Total pack years: 40.00    Types: Cigarettes    Quit date: 10/2021    Years since quitting: 0.7    Passive exposure: Never   Smokeless tobacco: Never  Substance Use Topics   Alcohol use: Not Currently    Comment: ocassionally    CODE STATUS: FULL CODE ADVANCED DIRECTIVES: N MOST FORM COMPLETE: N HOSPICE EDUCATION PROVIDED: N  PPS: Patient is independent with ADL's but requires with household chores.        Ryan Cannon, [place]

## 2022-08-06 ENCOUNTER — Ambulatory Visit: Payer: Medicaid Other

## 2022-08-06 ENCOUNTER — Other Ambulatory Visit: Payer: Self-pay | Admitting: *Deleted

## 2022-08-06 DIAGNOSIS — G893 Neoplasm related pain (acute) (chronic): Secondary | ICD-10-CM

## 2022-08-06 DIAGNOSIS — R29898 Other symptoms and signs involving the musculoskeletal system: Secondary | ICD-10-CM

## 2022-08-06 DIAGNOSIS — C3490 Malignant neoplasm of unspecified part of unspecified bronchus or lung: Secondary | ICD-10-CM

## 2022-08-06 DIAGNOSIS — R0602 Shortness of breath: Secondary | ICD-10-CM

## 2022-08-07 ENCOUNTER — Ambulatory Visit: Payer: Medicaid Other

## 2022-08-07 MED FILL — Dexamethasone Sodium Phosphate Inj 100 MG/10ML: INTRAMUSCULAR | Qty: 1 | Status: AC

## 2022-08-07 NOTE — Telephone Encounter (Signed)
PCS application faxed to [employer] for signature.

## 2022-08-08 ENCOUNTER — Ambulatory Visit: Payer: Medicaid Other | Admitting: Internal Medicine

## 2022-08-08 ENCOUNTER — Inpatient Hospital Stay: Payer: Medicaid Other

## 2022-08-08 ENCOUNTER — Other Ambulatory Visit: Payer: Medicaid Other

## 2022-08-08 ENCOUNTER — Inpatient Hospital Stay: Payer: Medicaid Other | Admitting: Hospice and Palliative Medicine

## 2022-08-08 ENCOUNTER — Inpatient Hospital Stay (HOSPITAL_BASED_OUTPATIENT_CLINIC_OR_DEPARTMENT_OTHER): Payer: Medicaid Other | Admitting: Internal Medicine

## 2022-08-08 ENCOUNTER — Encounter: Payer: Self-pay | Admitting: Internal Medicine

## 2022-08-08 ENCOUNTER — Ambulatory Visit: Payer: Medicaid Other

## 2022-08-08 VITALS — BP 159/88 | HR 110 | Temp 95.8°F | Resp 20 | Wt 134.2 lb

## 2022-08-08 DIAGNOSIS — C3412 Malignant neoplasm of upper lobe, left bronchus or lung: Secondary | ICD-10-CM

## 2022-08-08 LAB — CBC WITH DIFFERENTIAL/PLATELET
Abs Immature Granulocytes: 0.17 10*3/uL — ABNORMAL HIGH (ref 0.00–0.07)
Basophils Absolute: 0 10*3/uL (ref 0.0–0.1)
Basophils Relative: 0 %
Eosinophils Absolute: 0.1 10*3/uL (ref 0.0–0.5)
Eosinophils Relative: 0 %
HCT: 34.6 % — ABNORMAL LOW (ref 39.0–52.0)
Hemoglobin: 10.6 g/dL — ABNORMAL LOW (ref 13.0–17.0)
Immature Granulocytes: 1 %
Lymphocytes Relative: 5 %
Lymphs Abs: 1.2 10*3/uL (ref 0.7–4.0)
MCH: 29.8 pg (ref 26.0–34.0)
MCHC: 30.6 g/dL (ref 30.0–36.0)
MCV: 97.2 fL (ref 80.0–100.0)
Monocytes Absolute: 1.1 10*3/uL — ABNORMAL HIGH (ref 0.1–1.0)
Monocytes Relative: 4 %
Neutro Abs: 22.7 10*3/uL — ABNORMAL HIGH (ref 1.7–7.7)
Neutrophils Relative %: 90 %
Platelets: 631 10*3/uL — ABNORMAL HIGH (ref 150–400)
RBC: 3.56 MIL/uL — ABNORMAL LOW (ref 4.22–5.81)
RDW: 17.2 % — ABNORMAL HIGH (ref 11.5–15.5)
WBC: 25.3 10*3/uL — ABNORMAL HIGH (ref 4.0–10.5)
nRBC: 0 % (ref 0.0–0.2)

## 2022-08-08 LAB — COMPREHENSIVE METABOLIC PANEL
ALT: 17 U/L (ref 0–44)
AST: 37 U/L (ref 15–41)
Albumin: 2.8 g/dL — ABNORMAL LOW (ref 3.5–5.0)
Alkaline Phosphatase: 217 U/L — ABNORMAL HIGH (ref 38–126)
Anion gap: 9 (ref 5–15)
BUN: 24 mg/dL — ABNORMAL HIGH (ref 6–20)
CO2: 28 mmol/L (ref 22–32)
Calcium: 9.5 mg/dL (ref 8.9–10.3)
Chloride: 100 mmol/L (ref 98–111)
Creatinine, Ser: 0.92 mg/dL (ref 0.61–1.24)
GFR, Estimated: >60 mL/min (ref >60)
Glucose, Bld: 140 mg/dL — ABNORMAL HIGH (ref 70–99)
Potassium: 4.5 mmol/L (ref 3.5–5.1)
Sodium: 137 mmol/L (ref 135–145)
Total Bilirubin: 0.4 mg/dL (ref 0.3–1.2)
Total Protein: 8.9 g/dL — ABNORMAL HIGH (ref 6.5–8.1)
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
## 2022-08-08 NOTE — Progress Notes (Signed)
Increasing back pain 10/10 pain scale today.  Left leg weakness that is causing him to have falls at home.    Improving appetite but has a 10 lb wt loss since 07/16/22.    BP 159/88, HR 110

## 2022-08-08 NOTE — Assessment & Plan Note (Addendum)
#  Stage IV lung cancer -adenocarcinoma lung primary.-bilateral lung masses / right scapular metastases  NGS/PD-L1 0; [Given Hx of MS-? CI to immunotherapy].   DEC 22nd, 2023- PET scan - Progressive hypermetabolic metastatic disease with multifocal hepatic, osseous and possible left adrenal metastases; Similar metabolic activity associated with the treated left perihilar and right middle lobe lesions, some of which may relate to posterior obstructive pneumonitis. JAN  16th 2024-CT scan-Enlarging right middle lobe mass, currently 6.3 by 5.0 cm, previously 5.7 by 4.6 cm. This mass is occluding the lateral segmental bronchus of the right middle lobe; 3. Enlarging right hilar adenopathy;  Heavy metastatic burden to the liver, increased from prior; Pathologic fracture of the left lateral eighth rib with adjacent pleural and intercostal tumor; 5 mm right lower lobe pulmonary nodule, new from 02/17/2022 and not well seen on 07/08/2022. Likely metastatic.  # Will discontinue current therapy Alimta Avastin therapy.  Discussed with the patient that/he is not a candidate for any further therapy given his declining performance status/in general reluctance with treatments follow-ups; also given the overall poor response rates for subsequent lines of therapies and high risk of side effects.  Patient and his sister are in agreement.  See discussion below     # multiple sclerosis-on Avonex  SQ. discussed with Dr. Sherryll Burger; neurology-states multiple sclerosis-  Stable.   # Pain right scapula-secondary metastatic malignancy - s/p  RT [Jan 30th-Feb, 13th]- declines fenatnyl Patch 75 mcg [followed by Josh] Consider zometa [needs dental clearance].  Worsening left chest wall pain- sec to metastases from prpgressive cancer; will refer to radiation for pain conrtol.  Continue oxycodone as prescribed by [employer].   # Had a long discussion the patient and his sister regarding the median survival of metastatic lung cancer is  approximately 1 year or so.  Patient was diagnosed approximately year ago.  given the incurable nature of the disease /poor tolerance of therapy and in general less than 6 months of life expectancy-I introduced hospice philosophy to the patient and family.  Discussed that goal of care should be directed to symptom management rather than treating the underlying disease; and in the process help improve quality of life rather than quantity.  Discussed with hospice team would include-nurse, nurse aide, social worker and chaplain for help take care of patient with physical/emotional needs.  Patient has previously met with palliative care.  Recommend hospice referral.  Also discussed with the patient's niece over the phone.  Family is in agreement.  # DISPOSITION:  # No chemo # referral to hospice re: lung cancer # follow up as needed-  Dr.B  # 40 minutes face-to-face with the patient discussing the above plan of care; more than 50% of time spent on prognosis/ natural history; counseling and coordination.  Also discussed with [employer].  # I reviewed the blood work- with the patient in detail; also reviewed the imaging independently [as summarized above]; and with the patient in detail.

## 2022-08-12 ENCOUNTER — Telehealth: Payer: Self-pay

## 2022-08-12 DIAGNOSIS — Z789 Other specified health status: Secondary | ICD-10-CM

## 2022-08-12 NOTE — Telephone Encounter (Signed)
(  08/11/22) Fax [facility] appliaction to [facility] [phone].

## 2022-08-14 ENCOUNTER — Ambulatory Visit: Payer: Medicaid Other

## 2022-08-19 ENCOUNTER — Other Ambulatory Visit: Payer: Medicaid Other

## 2022-08-21 DEATH — deceased

## 2022-09-03 ENCOUNTER — Ambulatory Visit: Payer: Medicaid Other | Admitting: Internal Medicine

## 2023-08-09 IMAGING — CT NM PET TUM IMG INITIAL (PI) SKULL BASE T - THIGH
10 series · 24 of 25 positions shown · non-contrast
Comparison: CTA chest 08/02/2021.

CLINICAL DATA: Initial treatment strategy for non-small-cell lung
cancer. Staging.

EXAM:
NUCLEAR MEDICINE PET SKULL BASE TO THIGH
TECHNIQUE: 9.4 mCi F-18 FDG was injected intravenously. Full-ring PET imaging
was performed from the skull base to thigh after the radiotracer. CT
data was obtained and used for attenuation correction and anatomic
localization.
Fasting blood glucose: 95 mg/dl

[Series 3: ct wb 5.0 b30f · axial · 5.0mm · 0.98mm/px · z∈[-1480,-496]mm · 3 of 329 slices shown]
[im 1/329]
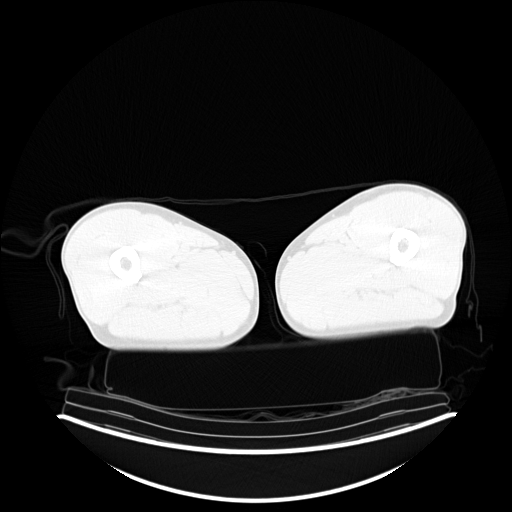
[im 165/329]
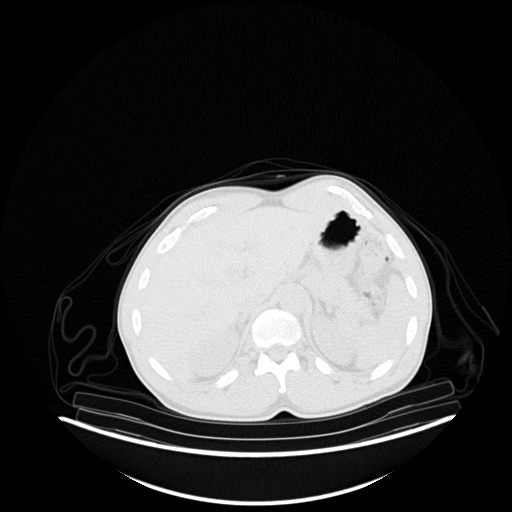
[im 329/329  brain]
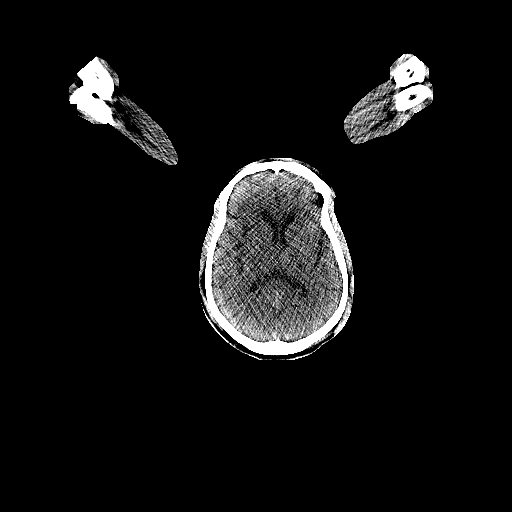

[Series 5: pet wb uncorrected (nac) · axial · 5.0mm · 4.07mm/px · z∈[-1480,-496]mm · 3 of 329 slices shown]
[im 1/329]
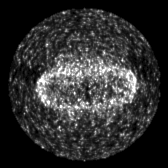
[im 165/329]
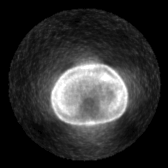
[im 329/329]
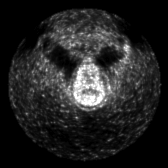

[Series 6: pet wb (ac) · axial · 5.0mm · 3.13mm/px · z∈[-1480,-496]mm · 4 of 329 slices shown]
[im 1/329]
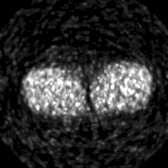
[im 110/329]
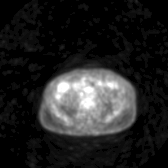
[im 219/329]
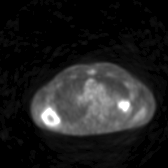
[im 329/329]
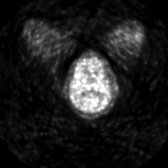

[Series 603: pet axial fused · 3 of 326 slices shown]
[im 1/326]
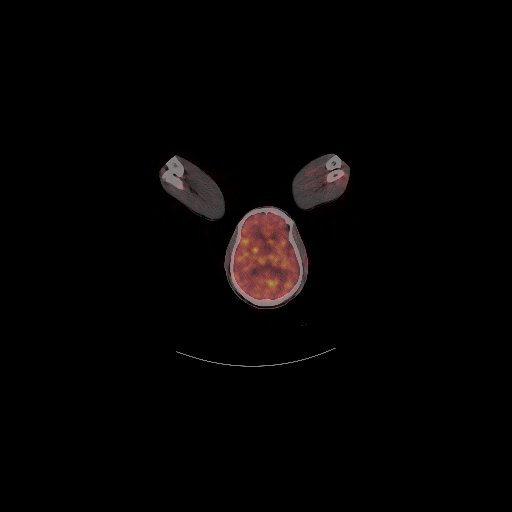
[im 109/326]
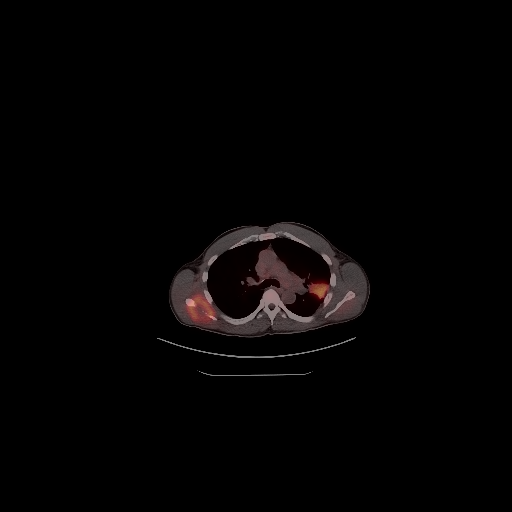
[im 326/326]
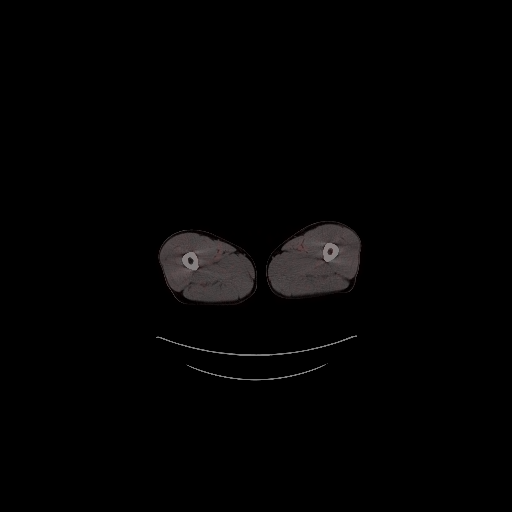

[Series 604: pet coronal fused · 1 of 97 slices shown]
[im 1/97]
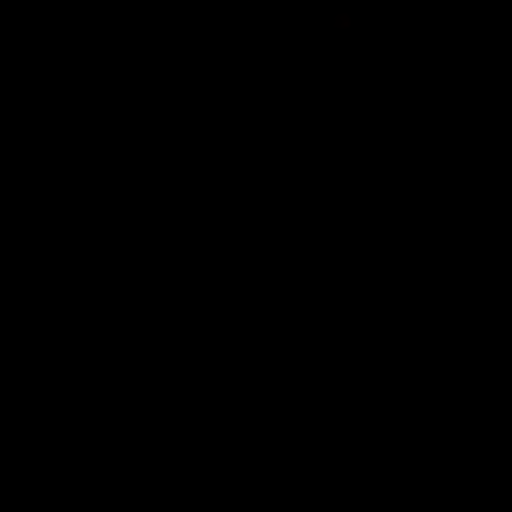

[Series 605: pet sagittal fused · 2 of 131 slices shown]
[im 1/131]
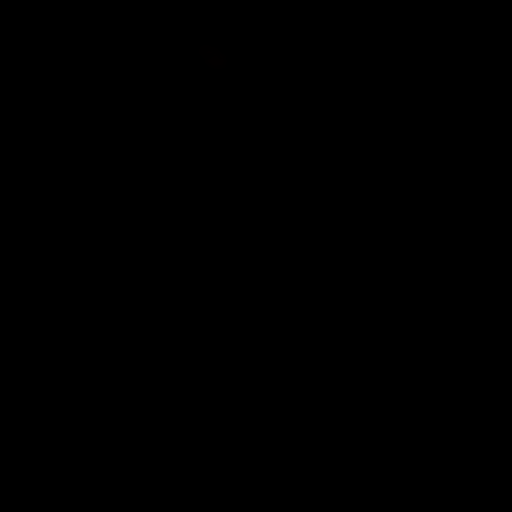
[im 131/131]
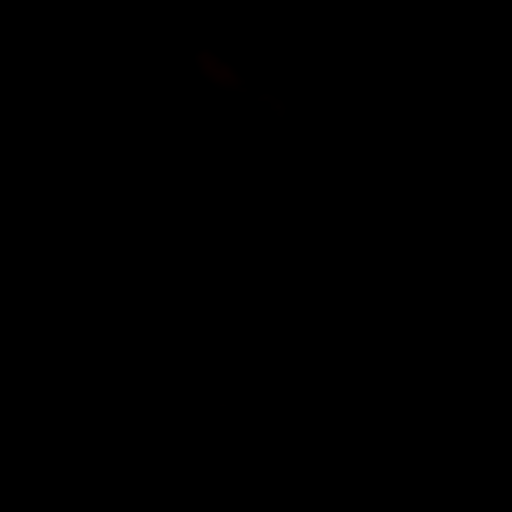

[Series 606: pet axial · 4 of 329 slices shown]
[im 1/329]
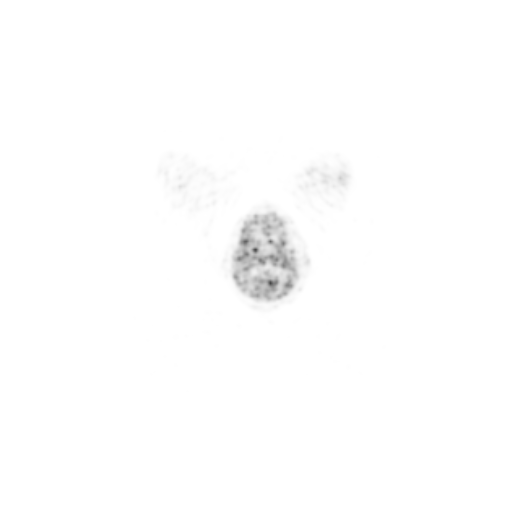
[im 110/329]
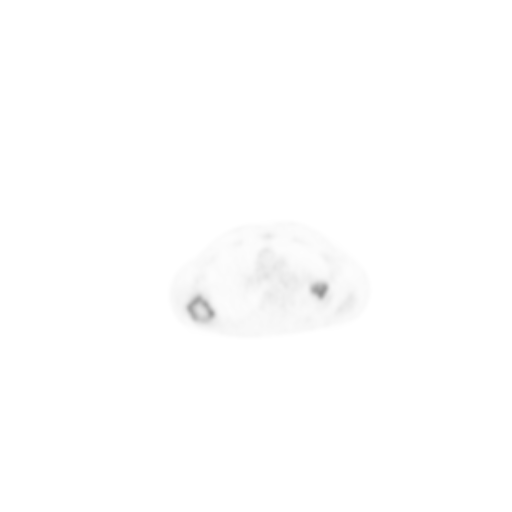
[im 219/329]
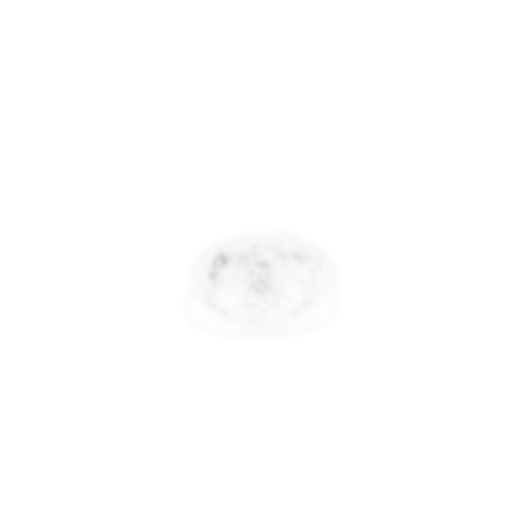
[im 329/329]
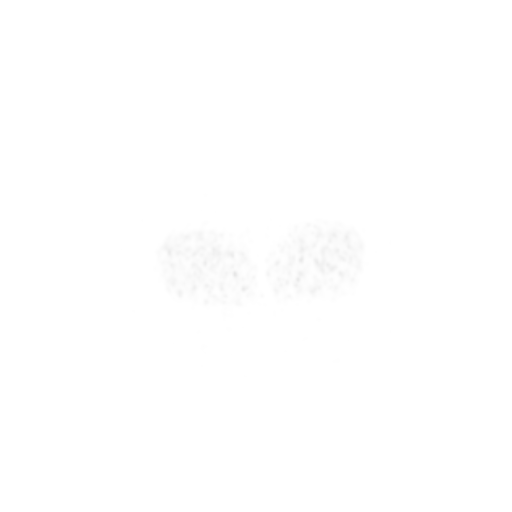

[Series 607: pet coronal · 1 of 126 slices shown]
[im 1/126]
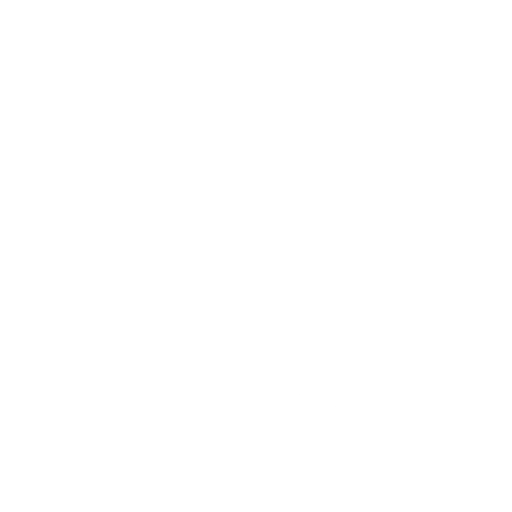

[Series 608: pet sagittal · 2 of 160 slices shown]
[im 1/160]
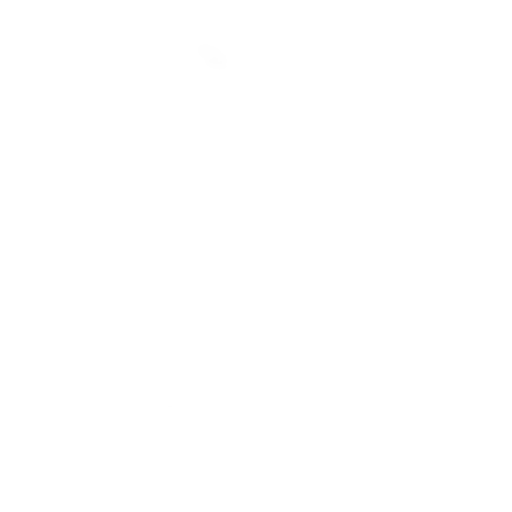
[im 160/160]
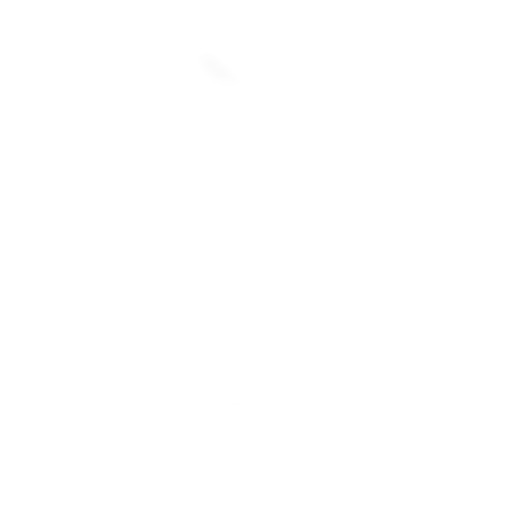

[Series 1109: results mm oncology reading · 3.0mm · 0.94mm/px · 1 of 9 slices shown]
[im 1/9]
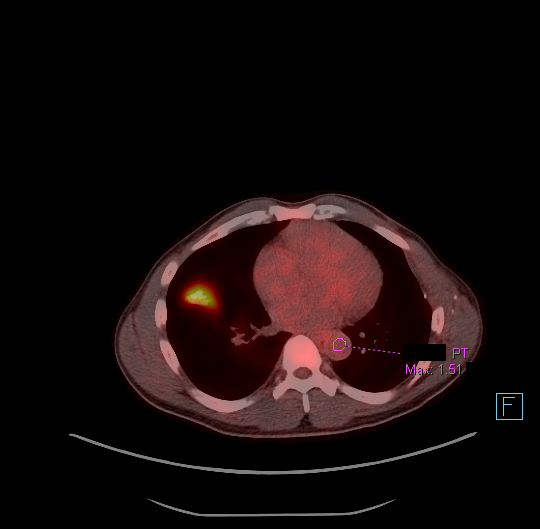

[24 of 25 positions shown; findings below may reference images not displayed]

FINDINGS: Mediastinal blood pool activity: SUV max

Liver activity: SUV max NA

NECK: No areas of abnormal hypermetabolism.

Incidental CT findings: No cervical adenopathy.

CHEST: Hypermetabolism corresponding to the posterior left upper
lobe lung mass. 4.6 cm and a S.U.V. max of 11.2 on 106/3.

Hypermetabolic right middle lobe lung mass, including at 4.7 x
cm and a S.U.V. max of 6.5 on 122/3.

A right hilar/infrahilar node measures 1.1 cm and a S.U.V. max of
6.3 on 126/3.

Incidental CT findings: Deferred to recent diagnostic CT.
Centrilobular and paraseptal emphysema.

ABDOMEN/PELVIS: Hypermetabolism corresponding to the high right
hepatic lobe hypoattenuating lesion on prior CT. Example 1.2 cm and
a S.U.V. max of 4.8 on 150/3.

No abdominopelvic nodal hypermetabolism.

Hypermetabolism with subcutaneous soft tissue thickening about the
left gluteal crease. Example at a S.U.V. max of 4.3 on 282/3.

Incidental CT findings: Normal adrenal glands. No renal calculi or
hydronephrosis. Subcentimeter more inferior right hepatic lobe cyst.
Normal noncontrast appearance of the pancreas, spleen, urinary
bladder, prostate.

SKELETON: Hypermetabolism corresponding to the destructive lesion
involving the inferior scapula with surrounding soft tissue
component. Example at a S.U.V. max of 12.4 on 110/3. Equivocal
hypermetabolism involving the posterior right second rib without CT
correlate. Example at a S.U.V. max of 3.1 on 70/3.

Incidental CT findings: Resection of a portion of the anterolateral
left sixth rib.
IMPRESSION: 1. Left upper and right middle lobe hypermetabolic lung masses,
favoring synchronous primary bronchogenic carcinomas.
2. Right hilar nodal, isolated hepatic, and right scapular osseous
metastasis. Equivocal hypermetabolism involving the posterior right
second rib.
3. Hypermetabolism and soft tissue thickening about the left gluteal
crease. Possibly related to cellulitis and perirectal fistula.
Consider physical exam correlation.
4.  Incidental findings, including: Emphysema ([id number]).

## 2024-04-26 NOTE — Progress Notes (Signed)
 This encounter was created in error - please disregard.
